# Patient Record
Sex: Female | Born: 1980 | Race: White | Hispanic: No | Marital: Married | Smoking: Never smoker
Health system: Southern US, Community
[De-identification: ages and names within clinical notes are randomized; demographics above are authoritative.]

## PROBLEM LIST (undated history)

## (undated) DIAGNOSIS — K219 Gastro-esophageal reflux disease without esophagitis: Secondary | ICD-10-CM

## (undated) HISTORY — PX: APPENDECTOMY: SHX54

---

## 2007-01-10 ENCOUNTER — Emergency Department (HOSPITAL_COMMUNITY): Admission: EM | Admit: 2007-01-10 | Discharge: 2007-01-10 | Payer: Self-pay | Admitting: Emergency Medicine

## 2013-11-03 ENCOUNTER — Emergency Department
Admission: EM | Admit: 2013-11-03 | Discharge: 2013-11-03 | Disposition: A | Discharge status: Home / Self Care | Payer: BC Managed Care – PPO | Source: Home / Self Care | Attending: Emergency Medicine | Admitting: Emergency Medicine

## 2013-11-03 ENCOUNTER — Encounter: Payer: Self-pay | Admitting: Emergency Medicine

## 2013-11-03 ENCOUNTER — Emergency Department (INDEPENDENT_AMBULATORY_CARE_PROVIDER_SITE_OTHER): Payer: BC Managed Care – PPO

## 2013-11-03 DIAGNOSIS — M7989 Other specified soft tissue disorders: Secondary | ICD-10-CM

## 2013-11-03 DIAGNOSIS — S93402A Sprain of unspecified ligament of left ankle, initial encounter: Secondary | ICD-10-CM

## 2013-11-03 DIAGNOSIS — S93409A Sprain of unspecified ligament of unspecified ankle, initial encounter: Secondary | ICD-10-CM

## 2013-11-03 MED ORDER — HYDROCODONE-ACETAMINOPHEN 5-325 MG PO TABS: 1.0000 | ORAL_TABLET | ORAL | Status: DC | PRN

## 2013-11-03 NOTE — Discharge Instructions (Signed)
Your x-ray of left ankle was negative.--No fracture or dislocation seen.  For mild to moderate pain, may use over-the-counter ibuprofen or Mobic as prescribed.  For severe acute pain, Vicodin, 1 or 2 every 4-6 hours if needed.  Followup with orthopedist in one week

## 2013-11-03 NOTE — ED Provider Notes (Signed)
CSN: [id number]     Arrival date & time 11/03/13  1744 History   First MD Initiated Contact with Patient 11/03/13 1811     Chief Complaint  Patient presents with  . Ankle Pain    left ankle pain x 1 day   (Consider location/radiation/quality/duration/timing/severity/associated sxs/prior Treatment) Patient is a 33 y.o. female presenting with ankle pain.  Ankle Pain  recalls no injury, but she has been very active the past few days. Complains of moderate to severe sharp left ankle pain times one day . No history of gout or arthritis.  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  No fever or chills. No coryza or URI symptoms or chest pain or dyspnea or GI symptoms. No rash  History reviewed. No pertinent past medical history. History reviewed. No pertinent past surgical history. Family History  Problem Relation Age of Onset  . Hypertension Mother    History  Substance Use Topics  . Smoking status: Never Smoker   . Smokeless tobacco: Never Used  . Alcohol Use: Yes   OB History   Grav Para Term Preterm Abortions TAB SAB Ect Mult Living                 Review of Systems  All other systems reviewed and are negative.    Allergies  Review of patient's allergies indicates no known allergies.  Home Medications   Current Outpatient Rx  Name  Route  Sig  Dispense  Refill  . buPROPion (WELLBUTRIN SR) 150 MG 12 hr tablet   Oral   Take 150 mg by mouth 2 (two) times daily.         Marland Kitchen. HYDROcodone-acetaminophen (NORCO/VICODIN) 5-325 MG per tablet   Oral   Take 1-2 tablets by mouth every 4 (four) hours as needed for severe pain. Take with food.   12 tablet   0    BP 120/84  Pulse 105  Temp(Src) 98.8 F (37.1 C) (Oral)  Ht 5\' 5"  (1.651 m)  Wt 221 lb (100.245 kg)  BMI 36.78 kg/m2  SpO2 97%  LMP 10/20/2013 Physical Exam  Nursing note and vitals reviewed. Constitutional: She is oriented to  person, place, and time. She appears well-developed and well-nourished. No distress.  HENT:  Head: Normocephalic and atraumatic.  Eyes: Conjunctivae and EOM are normal. Pupils are equal, round, and reactive to light. No scleral icterus.  Neck: Normal range of motion.  Cardiovascular: Normal rate.   Pulmonary/Chest: Effort normal.  Abdominal: She exhibits no distension.  Musculoskeletal:       Left ankle: She exhibits decreased range of motion and swelling (Lateral malleolus). She exhibits no deformity, no laceration and normal pulse. Tenderness. Lateral malleolus and AITFL tenderness found. No medial malleolus, no posterior TFL, no head of 5th metatarsal and no proximal fibula tenderness found. Achilles tendon normal.       Left foot: Normal.  Neurological: She is alert and oriented to person, place, and time.  Skin: Skin is warm.  Psychiatric: She has a normal mood and affect.   No redness or heat --left ankle ED Course  Procedures (including critical care time) Labs Review Labs Reviewed - No data to display Imaging Review Dg Ankle Complete Left  11/03/2013   CLINICAL DATA:  Left lateral ankle pain.  EXAM: LEFT ANKLE COMPLETE - 3+ VIEW  COMPARISON:  None.  FINDINGS: Mild swelling is noted over the  lateral malleolus. The joint is located. No acute bone or soft tissue abnormality is present. There is no significant joint effusion.  IMPRESSION: 1. Mild soft tissue swelling over the lateral malleolus without an underlying fracture. 2. No acute osseous abnormality.   Electronically Signed   By: Gennette Pac M.D.   On: 11/03/2013 18:57    EKG Interpretation    Date/Time:    Ventricular Rate:    PR Interval:    QRS Duration:   QT Interval:    QTC Calculation:   R Axis:     Text Interpretation:             X-ray reviewed by me in detail. No acute abnormalities seen. No fracture or dislocation.--There is soft tissue swelling over lateral malleolus MDM   1. Severe sprain of  left ankle    Discussed with patient Encourage rest, ice, compression with ACE bandage, and elevation of injured body part. She prefers to use an ankle brace that she has at home from a prior injury a long time ago. Mobic for moderate pain. I prescribed a small amount of Vicodin when necessary severe pain Precautions discussed. Red flags discussed. Questions invited and answered. Patient voiced understanding and agreement.     Lajean Manes, MD 11/03/13 [phone]

## 2013-11-03 NOTE — ED Notes (Signed)
Kennesha complains of left ankle pain for 1 day. Denies injury.

## 2014-12-01 ENCOUNTER — Ambulatory Visit (INDEPENDENT_AMBULATORY_CARE_PROVIDER_SITE_OTHER): Payer: BLUE CROSS/BLUE SHIELD | Admitting: Family Medicine

## 2014-12-01 ENCOUNTER — Ambulatory Visit (INDEPENDENT_AMBULATORY_CARE_PROVIDER_SITE_OTHER): Payer: BLUE CROSS/BLUE SHIELD

## 2014-12-01 VITALS — BP 168/90 | HR 90 | Temp 97.8°F | Resp 18 | Ht 65.5 in | Wt 230.6 lb

## 2014-12-01 DIAGNOSIS — R059 Cough, unspecified: Secondary | ICD-10-CM

## 2014-12-01 DIAGNOSIS — R0781 Pleurodynia: Secondary | ICD-10-CM

## 2014-12-01 DIAGNOSIS — R05 Cough: Secondary | ICD-10-CM | POA: Diagnosis not present

## 2014-12-01 LAB — POCT CBC
Granulocyte percent: 15 %G — AB (ref 37–80)
HCT, POC: 42.1 % (ref 37.7–47.9)
HEMOGLOBIN: 14.1 g/dL (ref 12.2–16.2)
LYMPH, POC: 3.5 — AB (ref 0.6–3.4)
MCH: 28.1 pg (ref 27–31.2)
MCHC: 33.4 g/dL (ref 31.8–35.4)
MCV: 84 fL (ref 80–97)
MID (cbc): 1.6 — AB (ref 0–0.9)
MPV: 7 fL (ref 0–99.8)
PLATELET COUNT, POC: 384 10*3/uL (ref 142–424)
POC Granulocyte: 74.4 — AB (ref 2–6.9)
POC LYMPH %: 17.5 % (ref 10–50)
POC MID %: 8.1 % (ref 0–12)
RBC: 5.01 M/uL (ref 4.04–5.48)
RDW, POC: 14.4 %
WBC: 20.1 10*3/uL — AB (ref 4.6–10.2)

## 2014-12-01 MED ORDER — AZITHROMYCIN 500 MG PO TABS: 500.0000 mg | ORAL_TABLET | Freq: Every day | ORAL | Status: DC

## 2014-12-01 MED ORDER — ALBUTEROL SULFATE (2.5 MG/3ML) 0.083% IN NEBU
2.5000 mg | INHALATION_SOLUTION | Freq: Once | RESPIRATORY_TRACT | Status: AC
  Administered 2014-12-01: 2.5 mg via RESPIRATORY_TRACT

## 2014-12-01 MED ORDER — HYDROCOD POLST-CHLORPHEN POLST 10-8 MG/5ML PO LQCR: 5.0000 mL | Freq: Two times a day (BID) | ORAL | Status: DC | PRN

## 2014-12-01 MED ORDER — ALBUTEROL SULFATE 108 (90 BASE) MCG/ACT IN AEPB: 2.0000 | INHALATION_SPRAY | RESPIRATORY_TRACT | Status: DC | PRN

## 2014-12-01 NOTE — Patient Instructions (Signed)

## 2014-12-01 NOTE — Progress Notes (Addendum)
Subjective:    Patient ID: Kimberly Archer, female    DOB: [birth year], 34 y.o.   MRN: [id number] This chart was scribed for Norberto SorensonEva Shaw, MD by [place] Peace, ED Scribe. [id number] The patient's care was started at 11:29 AM.  Chief Complaint  Patient presents with  . Cough    X 3 WEEKS, PERSISTENT PRODUCTIVE COUGH WITH GREEN PHLEGM, CURRENTLY ON MEDICATION BUT NOT HELPING  . Chest Pain    RIB PAIN, FELT LIKE SOMETHING POPPED YESTERDAY AND NOW IN PAIN,     HPI HPI Comments: Kimberly Hibbsrin N Archer is a 34 y.o. female [facility] of cough and chest pain/tightness onset a few days ago that has been progressively worsening. Pt reports she has been seen at the hospital and another medical clinic for similar issues which are responsible for current symptoms. Pt states she feels like she's "breathing through a straw" and adds she's experiencing excruciating pain in her side. Pt explains she has not taken any medication today because she has had no relief with any other medications that she has tried. No history of asthma or inhaler use.     History reviewed. No pertinent past medical history. Current Outpatient Prescriptions on File Prior to Visit  Medication Sig Dispense Refill  . buPROPion (WELLBUTRIN SR) 150 MG 12 hr tablet Take 150 mg by mouth 2 (two) times daily.     No current facility-administered medications on file prior to visit.   No Known Allergies    Review of Systems  Respiratory: Positive for cough and chest tightness.   Cardiovascular: Positive for chest pain.      BP 168/90 mmHg  Pulse 90  Temp(Src) 97.8 F (36.6 C) (Oral)  Resp 18  Ht 5' 5.5" (1.664 m)  Wt 230 lb 9.6 oz (104.599 kg)  BMI 37.78 kg/m2  SpO2 98%      Objective:   Physical Exam  Constitutional: She is oriented to person, place, and time. She appears well-developed and well-nourished. No distress.  HENT:  Head: Normocephalic and atraumatic.  Right Ear: Tympanic  membrane normal.  Left Ear: Tympanic membrane normal.  Mouth/Throat: Posterior oropharyngeal erythema present. No oropharyngeal exudate.  Eyes: Conjunctivae and EOM are normal.  Neck: Neck supple. No tracheal deviation present.  Cardiovascular: Normal rate.   Pulmonary/Chest: Effort normal. No respiratory distress.  Basilar inspiratory and expiratory rhonchi that clears with deep breathing.   Musculoskeletal: Normal range of motion.  Neurological: She is alert and oriented to person, place, and time.  Skin: Skin is warm and dry.  Psychiatric: She has a normal mood and affect. Her behavior is normal.  Nursing note and vitals reviewed.    [facility] reading (PRIMARY) by  Dr. Clelia CroftShaw. CXR: normal  Dg Ribs Unilateral W/chest Right  12/01/2014   CLINICAL DATA:  Subacute cough and right anterior rib pain. Initial encounter.  EXAM: RIGHT RIBS AND CHEST - 3+ VIEW  COMPARISON:  01/10/2007  FINDINGS: The cardiomediastinal silhouette is unremarkable.  Mild peribronchial thickening again noted.  There is no evidence of focal airspace disease, pulmonary edema, suspicious pulmonary nodule/mass, pleural effusion, or pneumothorax. No acute bony abnormalities are identified.  IMPRESSION: No evidence of acute cardiopulmonary disease.  Mild chronic peribronchial thickening.   Electronically Signed   By: Harmon PierJeffrey  Hu M.D.   On: 12/01/2014 12:52    Results for orders placed or performed in visit on 12/01/14  POCT CBC  Result Value Ref Range  WBC 20.1 (A) 4.6 - 10.2 K/uL   Lymph, poc 3.5 (A) 0.6 - 3.4   POC LYMPH PERCENT 17.5 10 - 50 %L   MID (cbc) 1.6 (A) 0 - 0.9   POC MID % 8.1 0 - 12 %M   POC Granulocyte 74.4 (A) 2 - 6.9   Granulocyte percent 15.0 (A) 37 - 80 %G   RBC 5.01 4.04 - 5.48 M/uL   Hemoglobin 14.1 12.2 - 16.2 g/dL   HCT, POC 16.1 09.6 - 47.9 %   MCV 84.0 80 - 97 fL   MCH, POC 28.1 27 - 31.2 pg   MCHC 33.4 31.8 - 35.4 g/dL   RDW, POC 04.5 %   Platelet Count, POC 384 142 - 424 K/uL   MPV 7.0 0 -  99.8 fL    Assessment & Plan:   Cough - Plan: POCT CBC, DG Ribs Unilateral W/Chest Right, albuterol (PROVENTIL) (2.5 MG/3ML) 0.083% nebulizer solution 2.5 mg  Rib pain on right side  Meds ordered this encounter  Medications  . methylPREDNISolone (MEDROL DOSEPAK) 4 MG tablet    Sig: Take 4 mg by mouth. follow package directions  . DISCONTD: azithromycin (ZITHROMAX) 500 MG tablet    Sig: Take 1 tablet (500 mg total) by mouth daily.    Dispense:  3 tablet    Refill:  0  . chlorpheniramine-HYDROcodone (TUSSIONEX PENNKINETIC ER) 10-8 MG/5ML LQCR    Sig: Take 5 mLs by mouth every 12 (twelve) hours as needed.    Dispense:  120 mL    Refill:  0  . azithromycin (ZITHROMAX) 500 MG tablet    Sig: Take 1 tablet (500 mg total) by mouth daily.    Dispense:  3 tablet    Refill:  0  . albuterol (PROVENTIL) (2.5 MG/3ML) 0.083% nebulizer solution 2.5 mg    Sig:   . DISCONTD: Albuterol Sulfate (PROAIR RESPICLICK) 108 (90 BASE) MCG/ACT AEPB    Sig: Inhale 2 puffs into the lungs every 4 (four) hours as needed.    Dispense:  1 each    Refill:  0    I personally performed the services described in this documentation, which was scribed in my presence. The recorded information has been reviewed and considered, and addended by me as needed.  Norberto Sorenson, MD MPH

## 2014-12-05 ENCOUNTER — Telehealth: Payer: Self-pay

## 2014-12-05 NOTE — Telephone Encounter (Signed)
Pt saw Kimberly MochaEva N Shaw, MD at 12/01/2014 11:04 AM for Cough, and Rib pain on right side. She was told to call back if she did not feel any better, and she does not. Please advise pt.

## 2014-12-05 NOTE — Telephone Encounter (Signed)
Dr. Clelia CroftShaw I was looking for your plan but didn't see it in your note. What are the next steps?

## 2014-12-06 MED ORDER — AMOXICILLIN-POT CLAVULANATE 875-125 MG PO TABS: 1.0000 | ORAL_TABLET | Freq: Two times a day (BID) | ORAL | Status: DC

## 2014-12-06 MED ORDER — ALBUTEROL SULFATE 108 (90 BASE) MCG/ACT IN AEPB: 2.0000 | INHALATION_SPRAY | RESPIRATORY_TRACT | Status: DC | PRN

## 2014-12-06 NOTE — Telephone Encounter (Signed)
Spoke with pt, advised message from Dr. Clelia CroftShaw. Pt understood. [place]

## 2014-12-06 NOTE — Telephone Encounter (Signed)
I sent augmentin into her pharmacy just now since she did not improve on azithromycin.  She is likely completed her medrol dose pack by now so if she is still having any shortness of breath or wheezing, it would be best for her to come back in to see if she needs any additional steroids, make sure her blood counts are getting better, etc.  She can pick up the augmentin and get 2 doses in today but if she is still feeling worse or her symptoms have not turned around by Sunday (48 hrs) definitely would recommend she be seen again due to her marked elev wbc count and abnml CXR.

## 2015-10-23 ENCOUNTER — Encounter (HOSPITAL_BASED_OUTPATIENT_CLINIC_OR_DEPARTMENT_OTHER): Payer: Self-pay | Admitting: Emergency Medicine

## 2015-10-23 ENCOUNTER — Emergency Department (HOSPITAL_BASED_OUTPATIENT_CLINIC_OR_DEPARTMENT_OTHER)
Admission: EM | Admit: 2015-10-23 | Discharge: 2015-10-23 | Disposition: A | Discharge status: Home / Self Care | Payer: Worker's Compensation | Attending: Emergency Medicine | Admitting: Emergency Medicine

## 2015-10-23 DIAGNOSIS — Z792 Long term (current) use of antibiotics: Secondary | ICD-10-CM | POA: Insufficient documentation

## 2015-10-23 DIAGNOSIS — M5416 Radiculopathy, lumbar region: Secondary | ICD-10-CM | POA: Insufficient documentation

## 2015-10-23 DIAGNOSIS — Y990 Civilian activity done for income or pay: Secondary | ICD-10-CM | POA: Insufficient documentation

## 2015-10-23 DIAGNOSIS — Y9389 Activity, other specified: Secondary | ICD-10-CM | POA: Diagnosis not present

## 2015-10-23 DIAGNOSIS — Y9289 Other specified places as the place of occurrence of the external cause: Secondary | ICD-10-CM | POA: Insufficient documentation

## 2015-10-23 DIAGNOSIS — Z79899 Other long term (current) drug therapy: Secondary | ICD-10-CM | POA: Diagnosis not present

## 2015-10-23 DIAGNOSIS — Z7952 Long term (current) use of systemic steroids: Secondary | ICD-10-CM | POA: Diagnosis not present

## 2015-10-23 DIAGNOSIS — X509XXA Other and unspecified overexertion or strenuous movements or postures, initial encounter: Secondary | ICD-10-CM | POA: Diagnosis not present

## 2015-10-23 DIAGNOSIS — S3992XA Unspecified injury of lower back, initial encounter: Secondary | ICD-10-CM | POA: Diagnosis present

## 2015-10-23 MED ORDER — HYDROCODONE-ACETAMINOPHEN 5-325 MG PO TABS
1.0000 | ORAL_TABLET | Freq: Four times a day (QID) | ORAL | Status: DC | PRN
Start: 2015-10-23 — End: 2017-08-31

## 2015-10-23 MED ORDER — HYDROCODONE-ACETAMINOPHEN 5-325 MG PO TABS
1.0000 | ORAL_TABLET | Freq: Once | ORAL | Status: AC
  Administered 2015-10-23: 1 via ORAL
  Filled 2015-10-23: qty 1

## 2015-10-23 NOTE — ED Notes (Signed)
MD at bedside. 

## 2015-10-23 NOTE — ED Provider Notes (Signed)
CSN: [id number]     Arrival date & time 10/23/15  2034 History  By signing my name below, I, Jarvis Morgan, attest that this documentation has been prepared under the direction and in the presence of Kimberly Libra, MD. Electronically Signed: Jarvis Morgan, ED Scribe. 10/23/2015. 11:06 PM.    Chief Complaint  Patient presents with  . Hip Pain   No language interpreter was used.    HPI Comments: Kimberly Archer is a 35 y.o. female with no PMHx who presents to the Emergency Department complaining of worsening, constant, sharp pain that radiates from her left lower back in about the L4 dermatone that began two days ago. 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 Pain is moderate to severe, worse with ambulation in certain positions. There is associated numbness in the toes of her left foot. She reports she was treated at [facility] and given methocarbamol, Ibuprofen and stretches to do with no significant relief. She denies any other injuries. She denies any other associated symptoms    History reviewed. No pertinent past medical history. History reviewed. No pertinent past surgical history. Family History  Problem Relation Age of Onset  . Hypertension Mother    Social History  Substance Use Topics  . Smoking status: Never Smoker   . Smokeless tobacco: Never Used  . Alcohol Use: Yes   OB History    No data available     Review of Systems A complete 10 system review of systems was obtained and all systems are negative except as noted in the HPI and PMH.     Allergies  Review of patient's allergies indicates no known allergies.  Home Medications   Prior to Admission medications   Medication Sig Start Date End Date Taking? Authorizing Provider  Albuterol Sulfate (PROAIR RESPICLICK) 108 (90 BASE) MCG/ACT AEPB Inhale 2 puffs into the lungs every 4 (four) hours as needed. 12/06/14   Sherren Mocha, MD  amoxicillin-clavulanate  (AUGMENTIN) 875-125 MG per tablet Take 1 tablet by mouth 2 (two) times daily. 12/06/14   Sherren Mocha, MD  azithromycin (ZITHROMAX) 500 MG tablet Take 1 tablet (500 mg total) by mouth daily. 12/01/14   Sherren Mocha, MD  buPROPion (WELLBUTRIN SR) 150 MG 12 hr tablet Take 150 mg by mouth 2 (two) times daily.    Historical Provider, MD  chlorpheniramine-HYDROcodone (TUSSIONEX PENNKINETIC ER) 10-8 MG/5ML LQCR Take 5 mLs by mouth every 12 (twelve) hours as needed. 12/01/14   Sherren Mocha, MD  methylPREDNISolone (MEDROL DOSEPAK) 4 MG tablet Take 4 mg by mouth. follow package directions 11/28/14   Historical Provider, MD   Triage Vitals: BP 135/74 mmHg  Pulse 96  Temp(Src) 98.5 F (36.9 C) (Oral)  Resp 18  Ht 5\' 5"  (1.651 m)  Wt 230 lb (104.327 kg)  BMI 38.27 kg/m2  SpO2 100%  LMP 09/22/2015 (Approximate)  Physical Exam  Nursing note and vitals reviewed. General: Well-developed, well-nourished female in no acute distress; appearance consistent with age of record HENT: normocephalic; atraumatic Eyes: pupils equal, round and reactive to light; extraocular muscles intact Neck: supple Heart: regular rate and rhythm Lungs: clear to auscultation bilaterally Abdomen: soft; nondistended; nontender; bowel sounds present Back: Left paralumbar tenderness; positive SLR on left at 30 degrees, negative SLR on right Extremities: No deformity; full range of motion except left hip limited by pain; pulses normal Neurologic: Awake, alert and oriented; motor function intact in all  extremities and symmetric; no facial droop Skin: Warm and dry Psychiatric: Normal mood and affect   ED Course  Procedures (including critical care time)   MDM  I personally performed the services described in this documentation, which was scribed in my presence. The recorded information has been reviewed and is accurate.    Kimberly LibraJohn Emera Bussie, MD 10/23/15 [phone]

## 2015-10-23 NOTE — ED Notes (Signed)
Patient states that she was bending over to help a patient and she felt a sharp pain to her left leg. [facility]. Today she states that she started to have numbess and tingling to her left leg and foot

## 2015-10-29 ENCOUNTER — Other Ambulatory Visit: Payer: Self-pay | Admitting: Occupational Medicine

## 2015-10-29 ENCOUNTER — Ambulatory Visit: Payer: Self-pay

## 2015-10-29 DIAGNOSIS — M549 Dorsalgia, unspecified: Secondary | ICD-10-CM

## 2016-04-28 ENCOUNTER — Encounter: Payer: Self-pay | Admitting: Emergency Medicine

## 2016-04-28 ENCOUNTER — Emergency Department (INDEPENDENT_AMBULATORY_CARE_PROVIDER_SITE_OTHER): Payer: Managed Care, Other (non HMO)

## 2016-04-28 ENCOUNTER — Emergency Department
Admission: EM | Admit: 2016-04-28 | Discharge: 2016-04-28 | Disposition: A | Discharge status: Home / Self Care | Payer: Managed Care, Other (non HMO) | Source: Home / Self Care | Attending: Family Medicine | Admitting: Family Medicine

## 2016-04-28 DIAGNOSIS — M25571 Pain in right ankle and joints of right foot: Secondary | ICD-10-CM

## 2016-04-28 DIAGNOSIS — M7671 Peroneal tendinitis, right leg: Secondary | ICD-10-CM | POA: Diagnosis not present

## 2016-04-28 MED ORDER — MELOXICAM 15 MG PO TABS: 15.0000 mg | ORAL_TABLET | Freq: Every day | ORAL | Status: DC

## 2016-04-28 NOTE — ED Provider Notes (Signed)
CSN: [id number]     Arrival date & time 04/28/16  [id number] History   First MD Initiated Contact with Patient 04/28/16 [phone]     Chief Complaint  Patient presents with  . Ankle Pain      HPI Comments: Patient complains of onset of right lateral ankle pain three days ago while working out in gym.  The pain seemed to resolve yesterday while at work, then recurred yesterday evening.  She has pain when pushing down the gas pedal in her car.  She recalls no injury.  She had similar pain two months ago that resolved.  She has had no response to Ibuprofen.  Patient is a 35 y.o. female presenting with ankle pain. The history is provided by the patient.  Ankle Pain Location:  Ankle Time since incident:  3 days Injury: no   Ankle location:  R ankle Pain details:    Quality:  Aching   Radiates to:  Does not radiate   Severity:  Mild   Onset quality:  Sudden   Duration:  3 days   Timing:  Constant   Progression:  Worsening Chronicity:  Recurrent Dislocation: no   Prior injury to area:  No Relieved by:  Nothing Exacerbated by: plantar flexion. Ineffective treatments:  NSAIDs Associated symptoms: decreased ROM and stiffness   Associated symptoms: no back pain, no fever, no muscle weakness, no numbness, no swelling and no tingling   Risk factors: obesity     History reviewed. No pertinent past medical history. History reviewed. No pertinent past surgical history. Family History  Problem Relation Age of Onset  . Hypertension Mother    Social History  Substance Use Topics  . Smoking status: Never Smoker   . Smokeless tobacco: Never Used  . Alcohol Use: Yes   OB History    No data available     Review of Systems  Constitutional: Negative for fever.  Musculoskeletal: Positive for stiffness. Negative for back pain.  All other systems reviewed and are negative.   Allergies  Review of patient's allergies indicates no known allergies.  Home Medications   Prior to Admission medications    Medication Sig Start Date End Date Taking? Authorizing Provider  omeprazole (PRILOSEC) 10 MG capsule Take 10 mg by mouth daily.   Yes Historical Provider, MD  buPROPion (WELLBUTRIN SR) 150 MG 12 hr tablet Take 150 mg by mouth 2 (two) times daily.    Historical Provider, MD  HYDROcodone-acetaminophen (NORCO/VICODIN) 5-325 MG tablet Take 1-2 tablets by mouth every 6 (six) hours as needed (for pain). 10/23/15   John Molpus, MD  meloxicam (MOBIC) 15 MG tablet Take 1 tablet (15 mg total) by mouth daily. Take with food each morning 04/28/16   Lattie HawStephen A Beese, MD   Meds Ordered and Administered this Visit  Medications - No data to display  BP 120/81 mmHg  Pulse 77  Temp(Src) 98.1 F (36.7 C) (Oral)  Ht 5\' 5"  (1.651 m)  Wt 196 lb (88.905 kg)  BMI 32.62 kg/m2  SpO2 97%  LMP 04/14/2016 No data found.   Physical Exam  Constitutional: She is oriented to person, place, and time. She appears well-developed and well-nourished. No distress.  HENT:  Head: Normocephalic.  Eyes: Conjunctivae are normal. Pupils are equal, round, and reactive to light.  Pulmonary/Chest: Effort normal.  Musculoskeletal:       Right ankle: She exhibits decreased range of motion. She exhibits no swelling, no ecchymosis, no deformity, no laceration and normal pulse. Tenderness. Lateral  malleolus tenderness found. No AITFL, no CF ligament, no posterior TFL, no head of 5th metatarsal and no proximal fibula tenderness found. Achilles tendon normal.       Feet:  Right ankle has decreased range of motion.  Joint is stable. There is tenderness over the course of the right peroneal tendon and base of 5th metatarsal.  Pain is elicited with resisted eversion and resisted plantar flexion of the ankle.  Distal neurovascular function is intact.    Neurological: She is alert and oriented to person, place, and time.  Skin: Skin is warm and dry. No rash noted.  Nursing note and vitals reviewed.   ED Course  Procedures   None  Imaging Review Dg Ankle Complete Right  04/28/2016  CLINICAL DATA:  Chronic pain laterally EXAM: RIGHT ANKLE - COMPLETE 3+ VIEW COMPARISON:  None. FINDINGS: Frontal, oblique, and lateral views obtained. There is mild generalized soft tissue swelling. Calcifications medial and inferior to the medial malleolus likely represent residua of old trauma. There is no demonstrable acute fracture or joint effusion. The ankle mortise appears intact. There is joint space narrowing medially. No erosive change. IMPRESSION: Evidence of old trauma in the medial malleolar region. Mild soft tissue swelling. No acute fracture evident. Ankle mortise appears intact. Mild osteoarthritic change noted. Electronically Signed   By: Bretta Bang III M.D.   On: 04/28/2016 09:46      MDM   1. Peroneal tendonitis of right lower extremity    Dispensed AirCast Stirrup splint.  Begin Mobic  daily. Apply ice pack for 20 to 30 minutes, 3 to 4 times daily  Continue until pain decreases.   Wear AirCast splint. Followup with Dr. Rodney Langton or Dr. Clementeen Graham ([facility]) if not improving about two weeks.     Lattie Haw, MD 04/28/16 1009

## 2016-04-28 NOTE — ED Notes (Signed)
Right ankle pain x 2 days, denies injury, just pain. 3/10

## 2016-04-28 NOTE — Discharge Instructions (Signed)
Apply ice pack for 20 to 30 minutes, 3 to 4 times daily  Continue until pain decreases.   Wear AirCast splint.    Peroneal Tendinitis With Rehab Tendonitis is inflammation of a tendon. Inflammation of the tendons on the back of the outer ankle (peroneal tendons) is known as peroneal tendonitis. The peroneal tendons are responsible for connecting the muscles that allow you to stand on your tiptoes to the bones of the ankle. For this reason, peroneal tendonitis often causes pain when trying to complete such motions. Peroneal tendonitis often involves a tear (strain) of the peroneal tendons. Strains are classified into three categories. Grade 1 strains cause pain, but the tendon is not lengthened. Grade 2 strains include a lengthened ligament, due to the ligament being stretched or partially ruptured. With grade 2 strains there is still function, although function may be decreased. Grade 3 strains involve a complete tear of the tendon or muscle, and function is usually impaired. SYMPTOMS   Pain, tenderness, swelling, warmth, or redness over the back of the outer side of the ankle, the outer part of the mid-foot, or the bottom of the arch.  Pain that gets worse with ankle motion (especially when pushing off or pushing down with the front of the foot), or when standing on the ball of the foot or pushing the foot outward.  Crackling sound (crepitation) when the tendon is moved or touched. CAUSES  Peroneal tendinitis occurs when injury to the peroneal tendons causes the body to respond with inflammation. Common causes of injury include:  An overuse injury, in which the groove behind the outer ankle (where the tendon is located) causes wear on the tendon.  A sudden stress placed on the tendon, such as from an increase in the intensity, frequency, or duration of training.  Direct hit (trauma) to the tendon.  Return to activity too soon after a previous ankle injury. RISK INCREASES WITH:  Sports that  require sudden, repetitive pushing off of the foot, such as jumping or quick starts.  Kicking and running sports, especially running down hills or long distances.  Poor strength and flexibility.  Previous injury to the foot, ankle, or leg. PREVENTION  Warm up and stretch properly before activity.  Allow for adequate recovery between workouts.  Maintain physical fitness:  Strength, flexibility, and endurance.  Cardiovascular fitness.  Complete rehabilitation after previous injury. PROGNOSIS  If treated properly, peroneal tendonitis usually heals within 6 weeks.  RELATED COMPLICATIONS  Longer healing time, if not properly treated or if not given enough time to heal.  Recurring symptoms if activity is resumed too soon, with overuse, or when using poor technique.  If untreated, tendinitis may result in tendon rupture, requiring surgery. TREATMENT  Treatment first involves the use of ice and medicine to reduce pain and inflammation. The use of strengthening and stretching exercises may help reduce pain with activity. These exercises may be performed at unsuccessful, surgery to remove the inflamed tendon lining (sheath) may be advised.  MEDICATION   If pain medicine is needed, nonsteroidal anti-inflammatory medicines (aspirin and ibuprofen), or other minor pain relievers (acetaminophen), are often advised.  Do not take pain medicine for 7 days before surgery.  Prescription pain relievers may be given, if your caregiver thinks they are needed. Use only as directed and only as much as you need. HEAT AND COLD  Cold treatment (icing) should be applied for 10 to 15 minutes every 2 to 3 hours for inflammation and pain, and immediately after activity  that aggravates your symptoms. Use ice packs or an ice massage.  Heat treatment may be used before performing stretching and strengthening activities prescribed by your caregiver, physical therapist, or athletic trainer. Use a heat pack or a  warm water soak. SEEK MEDICAL CARE IF:  Symptoms get worse or do not improve in 2 to 4 weeks, despite treatment.  New, unexplained symptoms develop. (Drugs used in treatment may produce side effects.) EXERCISES RANGE OF MOTION (ROM) AND STRETCHING EXERCISES - Peroneal Tendinitis These exercises may help you when beginning to rehabilitate your injury. Your symptoms may resolve with or without further involvement from your physician, physical therapist or athletic trainer. While completing these exercises, remember:   Restoring tissue flexibility helps normal motion to return to the joints. This allows healthier, less painful movement and activity.  An effective stretch should be held for at least 30 seconds.  A stretch should never be painful. You should only feel a gentle lengthening or release in the stretched tissue. RANGE OF MOTION - Ankle Eversion  Sit with your right / left ankle crossed over your opposite knee.  Grip your foot with your opposite hand, placing your thumb on the top of your foot and your fingers across the bottom of your foot.  Gently push your foot downward with a slight rotation, so your littlest toes rise slightly toward the ceiling.  You should feel a gentle stretch on the inside of your ankle. Hold the stretch for __________ seconds. Repeat __________ times. Complete this exercise __________ times per day.  RANGE OF MOTION - Ankle Inversion  Sit with your right / left ankle crossed over your opposite knee.  Grip your foot with your opposite hand, placing your thumb on the bottom of your foot and your fingers across the top of your foot.  Gently pull your foot so the smallest toe comes toward you and your thumb pushes the inside of the ball of your foot away from you.  You should feel a gentle stretch on the outside of your ankle. Hold the stretch for __________ seconds. Repeat __________ times. Complete this exercise __________ times per day.  RANGE OF  MOTION - Ankle Plantar Flexion  Sit with your right / left leg crossed over your opposite knee.  Use your opposite hand to pull the top of your foot and toes toward you.  You should feel a gentle stretch on the top of your foot and ankle. Hold this position for __________ seconds. Repeat __________ times. Complete __________ times per day.  STRETCH - Gastroc, Standing  Place your hands on a wall.  Extend your right / left leg behind you, keeping the front knee somewhat bent.  Slightly point your toes inward on your back foot.  Keeping your right / left heel on the floor and your knee straight, shift your weight toward the wall, not allowing your back to arch.  You should feel a gentle stretch in the calf. Hold this position for __________ seconds. Repeat __________ times. Complete this stretch __________ times per day. STRETCH - Soleus, Standing  Place your hands on a wall.  Extend your right / left leg behind you, keeping the other knee somewhat bent.  Slightly point your toes inward on your back foot.  Keep your heel on the floor, bend your back knee, and slightly shift your weight over the back leg so that you feel a gentle stretch deep in your back calf.  Hold this position for __________ seconds. Repeat __________ times.  Complete this stretch __________ times per day. STRETCH - Gastrocsoleus, Standing Note: This exercise can place a lot of stress on your foot and ankle. Please complete this exercise only if specifically instructed by your caregiver.   Place the ball of your right / left foot on a step, keeping your other foot firmly on the same step.  Hold on to the wall or a rail for balance.  Slowly lift your other foot, allowing your body weight to press your heel down over the edge of the step.  You should feel a stretch in your right / left calf.  Hold this position for __________ seconds.  Repeat this exercise with a slight bend in your knee. Repeat __________  times. Complete this stretch __________ times per day.  STRENGTHENING EXERCISES - Peroneal Tendinitis  These exercises may help you when beginning to rehabilitate your injury. They may resolve your symptoms with or without further involvement from your physician, physical therapist or athletic trainer. While completing these exercises, remember:   Muscles can gain both the endurance and the strength needed for everyday activities through controlled exercises.  Complete these exercises as instructed by your physician, physical therapist or athletic trainer. Increase the resistance and repetitions only as guided by your caregiver. STRENGTH - Dorsiflexors  Secure a rubber exercise band or tubing to a fixed object (table, pole) and loop the other end around your right / left foot.  Sit on the floor facing the fixed object. The band should be slightly tense when your foot is relaxed.  Slowly draw your foot back toward you, using your ankle and toes.  Hold this position for __________ seconds. Slowly release the tension in the band and return your foot to the starting position. Repeat __________ times. Complete this exercise __________ times per day.  STRENGTH - Towel Curls  Sit in a chair, on a non-carpeted surface.  Place your foot on a towel, keeping your heel on the floor.  Pull the towel toward your heel only by curling your toes. Keep your heel on the floor.  If instructed by your physician, physical therapist or athletic trainer, add weight to the end of the towel. Repeat __________ times. Complete this exercise __________ times per day. STRENGTH - Ankle Eversion   Secure one end of a rubber exercise band or tubing to a fixed object (table, pole). Loop the other end around your foot, just before your toes.  Place your fists between your knees. This will focus your strengthening at your ankle.  Drawing the band across your opposite foot, away from the pole, slowly, pull your little  toe out and up. Make sure the band is positioned to resist the entire motion.  Hold this position for __________ seconds.  Have your muscles resist the band, as it slowly pulls your foot back to the starting position. Repeat __________ times. Complete this exercise __________ times per day.    This information is not intended to replace advice given to you by your health care provider. Make sure you discuss any questions you have with your health care provider.   Document Released: 10/04/2005 Document Revised: 02/18/2015 Document Reviewed: 01/16/2009 Elsevier Interactive Patient Education [employer]

## 2017-08-31 ENCOUNTER — Other Ambulatory Visit: Payer: Self-pay

## 2017-08-31 ENCOUNTER — Encounter: Payer: Self-pay | Admitting: *Deleted

## 2017-08-31 ENCOUNTER — Emergency Department
Admission: EM | Admit: 2017-08-31 | Discharge: 2017-08-31 | Disposition: A | Discharge status: Home / Self Care | Payer: Federal, State, Local not specified - PPO | Source: Home / Self Care | Attending: Family Medicine | Admitting: Family Medicine

## 2017-08-31 DIAGNOSIS — B9789 Other viral agents as the cause of diseases classified elsewhere: Secondary | ICD-10-CM

## 2017-08-31 DIAGNOSIS — J069 Acute upper respiratory infection, unspecified: Secondary | ICD-10-CM

## 2017-08-31 HISTORY — DX: Gastro-esophageal reflux disease without esophagitis: K21.9

## 2017-08-31 LAB — POCT RAPID STREP A (OFFICE): RAPID STREP A SCREEN: NEGATIVE

## 2017-08-31 MED ORDER — AZITHROMYCIN 250 MG PO TABS: ORAL_TABLET | ORAL | 0 refills | Status: DC

## 2017-08-31 NOTE — Discharge Instructions (Signed)
Take plain guaifenesin (200mg  tabs, one or two every 4 hours) with plenty of water, for cough and congestion.  May add Pseudoephedrine (30mg , one or two every 4 to 6 hours) for sinus congestion.  Get adequate rest.   Also recommend using saline nasal spray several times daily and saline nasal irrigation (AYR is a common brand).  Use Flonase nasal spray each morning after using saline nasal irrigation. Try warm salt water gargles for sore throat.  Stop all antihistamines for now, and other non-prescription cough/cold preparations. May take Ibuprofen 200mg , 4 tabs every 8 hours with food for sore throat, fever, etc. May take Delsym Cough Suppressant at bedtime for nighttime cough.  Begin Azithromycin if not improving about one week or if persistent fever develops   Follow-up with family doctor if not improving about10 days.

## 2017-08-31 NOTE — ED Provider Notes (Signed)
[facility]    CSN: [id number] Arrival date & time: 08/31/17  0805     History   Chief Complaint Chief Complaint  Patient presents with  . Sore Throat    HPI Kimberly Archer is a 36 y.o. female.   Patient complains of three day history of typical cold-like symptoms developing over several days, including mild sore throat, hoarseness, sinus congestion, headache, fatigue, and cough.  Today she developed sweats and fever, and feels pressure in her left ear.    The history is provided by the patient.    Past Medical History:  Diagnosis Date  . GERD (gastroesophageal reflux disease)     There are no active problems to display for this patient.   History reviewed. No pertinent surgical history.  OB History    No data available       Home Medications    Prior to Admission medications   Medication Sig Start Date End Date Taking? Authorizing Provider  montelukast (SINGULAIR) 10 MG tablet Take 10 mg at bedtime by mouth.   Yes [provider]  omeprazole (PRILOSEC) 10 MG capsule Take 10 mg by mouth daily.   Yes [provider]  azithromycin (ZITHROMAX Z-PAK) 250 MG tablet Take 2 tabs today; then begin one tab once daily for 4 more days. (Rx void after 09/08/17) 08/31/17   Lattie HawBeese, Knox Holdman A, MD    Family History Family History  Problem Relation Age of Onset  . Hypertension Mother     Social History Social History   Tobacco Use  . Smoking status: Never Smoker  . Smokeless tobacco: Never Used  Substance Use Topics  . Alcohol use: Yes  . Drug use: No     Allergies   Patient has no known allergies.   Review of Systems Review of Systems  + sore throat + cough No pleuritic pain No wheezing + nasal congestion + post-nasal drainage + sinus pain/pressure No itchy/red eyes + left earache No hemoptysis No SOB + fever, + chills No nausea No vomiting No abdominal pain No diarrhea No urinary symptoms No skin rash +  fatigue No myalgias + headache Used OTC meds without relief    Physical Exam Triage Vital Signs ED Triage Vitals  Enc Vitals Group     BP 08/31/17 0826 (!) 145/93     Pulse Rate 08/31/17 0826 89     Resp 08/31/17 0826 16     Temp 08/31/17 0826 100.1 F (37.8 C)     Temp Source 08/31/17 0826 Oral     SpO2 08/31/17 0826 97 %     Weight 08/31/17 0826 192 lb (87.1 kg)     Height 08/31/17 0826 5\' 5"  (1.651 m)     Head Circumference --      Peak Flow --      Pain Score 08/31/17 0827 0     Pain Loc --      Pain Edu? --      Excl. in GC? --    No data found.  Updated Vital Signs BP (!) 145/93 (BP Location: Left Arm)   Pulse 89   Temp 100.1 F (37.8 C) (Oral)   Resp 16   Ht 5\' 5"  (1.651 m)   Wt 192 lb (87.1 kg)   LMP 08/31/2017   SpO2 97%   BMI 31.95 kg/m   Visual Acuity Right Eye Distance:   Left Eye Distance:   Bilateral Distance:    Right Eye Near:   Left  Eye Near:    Bilateral Near:     Physical Exam Nursing notes and Vital Signs reviewed. Appearance:  Patient appears stated age, and in no acute distress Eyes:  Pupils are equal, round, and reactive to light and accomodation.  Extraocular movement is intact.  Conjunctivae are not inflamed  Ears:  Canals normal.  Tympanic membranes normal.  Nose:  Mildly congested turbinates.  No sinus tenderness.  Neck:  Supple.  Enlarged posterior/lateral nodes are palpated bilaterally, tender to palpation on the left.   Lungs:  Clear to auscultation.  Breath sounds are equal.  Moving air well. Heart:  Regular rate and rhythm without murmurs, rubs, or gallops.  Abdomen:  Nontender without masses or hepatosplenomegaly.  Bowel sounds are present.  No CVA or flank tenderness.  Extremities:  No edema.  Skin:  No rash present.    UC Treatments / Results  Labs (all labs ordered are listed, but only abnormal results are displayed) Labs Reviewed  POCT RAPID STREP A (OFFICE) negative    EKG  EKG Interpretation None        Radiology No results found.  Procedures Procedures (including critical care time)  Medications Ordered in UC Medications - No data to display   Initial Impression / Assessment and Plan / UC Course  I have reviewed the triage vital signs and the nursing notes.  Pertinent labs & imaging results that were available during my care of the patient were reviewed by me and considered in my medical decision making (see chart for details).    There is no evidence of bacterial infection today.   Treat symptomatically for now  Take plain guaifenesin (200mg  tabs, one or two every 4 hours) with plenty of water, for cough and congestion.  May add Pseudoephedrine (30mg , one or two every 4 to 6 hours) for sinus congestion.  Get adequate rest.   Also recommend using saline nasal spray several times daily and saline nasal irrigation (AYR is a common brand).  Use Flonase nasal spray each morning after using saline nasal irrigation. Try warm salt water gargles for sore throat.  Stop all antihistamines for now, and other non-prescription cough/cold preparations. May take Ibuprofen 200mg , 4 tabs every 8 hours with food for sore throat, fever, etc. May take Delsym Cough Suppressant at bedtime for nighttime cough.  Begin Azithromycin if not improving about one week or if persistent fever develops (Given a prescription to hold, with an expiration date)  Follow-up with family doctor if not improving about10 days.     Final Clinical Impressions(s) / UC Diagnoses   Final diagnoses:  Viral URI with cough    ED Discharge Orders        Ordered    azithromycin (ZITHROMAX Z-PAK) 250 MG tablet     08/31/17 0851          Lattie HawBeese, Tima Curet A, MD 08/31/17 [phone]

## 2017-08-31 NOTE — ED Triage Notes (Signed)
Pt c/o LT ear pain, laryngitis, sore throat, fever, and cough x 3 days. She took Mucinex with APAP at 0720.

## 2019-11-08 ENCOUNTER — Emergency Department (INDEPENDENT_AMBULATORY_CARE_PROVIDER_SITE_OTHER): Payer: Federal, State, Local not specified - PPO

## 2019-11-08 ENCOUNTER — Other Ambulatory Visit: Payer: Self-pay

## 2019-11-08 ENCOUNTER — Emergency Department
Admission: EM | Admit: 2019-11-08 | Discharge: 2019-11-08 | Disposition: A | Discharge status: Home / Self Care | Payer: Federal, State, Local not specified - PPO | Source: Home / Self Care | Attending: Family Medicine | Admitting: Family Medicine

## 2019-11-08 DIAGNOSIS — R0609 Other forms of dyspnea: Secondary | ICD-10-CM

## 2019-11-08 DIAGNOSIS — R0600 Dyspnea, unspecified: Secondary | ICD-10-CM

## 2019-11-08 NOTE — Discharge Instructions (Addendum)
Try taking Prilosec for 7 to 10 days. If symptoms persist, recommend evaluation by pulmonologist. If intermittent palpitations persist, recommend evaluation by cardiologist.

## 2019-11-08 NOTE — ED Triage Notes (Signed)
Pt presents to clinic for unresolved sob. She was seen approximately 2 weeks ago to r/o covid. EKG and d-dimer done all results unremarkable. Of note, in 2008 while in a flight pt had to be taken to ER for respiratory distress as it was found she had enlarged lymph nodes affecting her lungs. Pt recently vaccinated for COVID. No other concerns/questions at this time.

## 2019-11-10 NOTE — ED Provider Notes (Signed)
[name]    CSN: [id number] Arrival date & time: 11/08/19  1347      History   Chief Complaint Chief Complaint  Patient presents with  . Shortness of Breath    HPI Kimberly Archer is a 39 y.o. female.   Patient complains of persistent shortness of breath and chest pressure with activity.  She was evaluated two weeks ago by her PCP.  At that time her exam, EKG, and D-dimer were normal.  COVID19 test was negative.  She states that she has recently been vaccinated for [place]. She complains of occasional brief fluttering in her chest.  She has a history of GERD for which she has taken Prilosec in the past. Of note:  Patient was evaluated for shortness of breath 01/11/2007.  Chest x-ray at that time revealed hilar adenopathy, ?sarcoid.  The history is provided by the patient.  Shortness of Breath Severity:  Mild Onset quality:  Gradual Duration:  3 weeks Timing:  Intermittent Progression:  Unchanged Chronicity:  Recurrent Context: activity   Context: not URI   Relieved by:  Nothing Worsened by:  Exertion and activity Ineffective treatments:  None tried Associated symptoms: no abdominal pain, no chest pain, no cough, no diaphoresis, no fever, no hemoptysis, no neck pain, no PND, no sore throat, no sputum production, no syncope, no swollen glands and no wheezing   Risk factors: obesity     Past Medical History:  Diagnosis Date  . GERD (gastroesophageal reflux disease)     There are no problems to display for this patient.   History reviewed. No pertinent surgical history.  OB History   No obstetric history on file.      Home Medications    Prior to Admission medications   Medication Sig Start Date End Date Taking? Authorizing Provider  azithromycin (ZITHROMAX Z-PAK) 250 MG tablet Take 2 tabs today; then begin one tab once daily for 4 more days. (Rx void after 09/08/17) 08/31/17   Kandra Nicolas, MD  montelukast (SINGULAIR) 10 MG tablet Take 10 mg  at bedtime by mouth.    [provider]  omeprazole (PRILOSEC) 10 MG capsule Take 10 mg by mouth daily.    [provider]  Albuterol Sulfate (PROAIR RESPICLICK) 258 (90 BASE) MCG/ACT AEPB Inhale 2 puffs into the lungs every 4 (four) hours as needed. 12/06/14 10/23/15  Shawnee Knapp, MD    Family History Family History  Problem Relation Age of Onset  . Hypertension Mother     Social History Social History   Tobacco Use  . Smoking status: Never Smoker  . Smokeless tobacco: Never Used  Substance Use Topics  . Alcohol use: Yes  . Drug use: No     Allergies   Patient has no known allergies.   Review of Systems Review of Systems  Constitutional: Negative for diaphoresis and fever.  HENT: Negative for rhinorrhea and sore throat.   Respiratory: Positive for shortness of breath. Negative for cough, hemoptysis, sputum production and wheezing.   Cardiovascular: Negative for chest pain, syncope and PND.  Gastrointestinal: Negative for abdominal pain.  Musculoskeletal: Negative for neck pain.  All other systems reviewed and are negative.    Physical Exam Triage Vital Signs ED Triage Vitals  Enc Vitals Group     BP 11/08/19 1431 123/83     Pulse Rate 11/08/19 1431 83     Resp 11/08/19 1431 (!) 21     Temp 11/08/19 1431 98.8 F (37.1 C)  Temp Source 11/08/19 1431 Oral     SpO2 11/08/19 1431 99 %     Weight 11/08/19 1432 201 lb (91.2 kg)     Height 11/08/19 1432 5\' 5"  (1.651 m)     Head Circumference --      Peak Flow --      Pain Score 11/08/19 1432 0     Pain Loc --      Pain Edu? --      Excl. in GC? --    No data found.  Updated Vital Signs BP 123/83 (BP Location: Right Arm)   Pulse 83   Temp 98.8 F (37.1 C) (Oral)   Resp (!) 21   Ht 5\' 5"  (1.651 m)   Wt 91.2 kg   SpO2 99%   BMI 33.45 kg/m   Visual Acuity Right Eye Distance:   Left Eye Distance:   Bilateral Distance:    Right Eye Near:   Left Eye Near:    Bilateral Near:      Physical Exam Nursing notes and Vital Signs reviewed. Appearance:  Patient appears stated age, and in no acute distress Eyes:  Pupils are equal, round, and reactive to light and accomodation.  Extraocular movement is intact.  Conjunctivae are not inflamed  Ears:  Canals normal.  Tympanic membranes normal.  Nose:  Normal turbinates.  No sinus tenderness.  Pharynx:  Normal Neck:  Supple.  No adenopathy. Lungs:  Clear to auscultation.  Breath sounds are equal.  Moving air well. Heart:  Regular rate and rhythm without murmurs, rubs, or gallops.  Abdomen:  Nontender without masses or hepatosplenomegaly.  Bowel sounds are present.  No CVA or flank tenderness.  Extremities:  No edema.  Skin:  No rash present.   UC Treatments / Results  Labs (all labs ordered are listed, but only abnormal results are displayed) Labs Reviewed - No data to display  EKG   Radiology DG Chest 2 View  Result Date: 11/08/2019 CLINICAL DATA:  Dyspnea on exertion EXAM: CHEST - 2 VIEW COMPARISON:  12/01/2014 FINDINGS: The heart size and mediastinal contours are within normal limits. Both lungs are clear. The visualized skeletal structures are unremarkable. IMPRESSION: No active cardiopulmonary disease. Electronically Signed   By: 11/10/2019 M.D.   On: 11/08/2019 15:21    Procedures Procedures (including critical care time)  Medications Ordered in UC Medications - No data to display  Initial Impression / Assessment and Plan / UC Course  I have reviewed the triage vital signs and the nursing notes.  Pertinent labs & imaging results that were available during my care of the patient were reviewed by me and considered in my medical decision making (see chart for details).    Normal exam and chest x-ray reassuring.  Patient's recurring chest pressure sensation may represent GERD. Recommend follow-up with PCP.   Final Clinical Impressions(s) / UC Diagnoses   Final diagnoses:  Dyspnea on exertion      Discharge Instructions     Try taking Prilosec for 7 to 10 days. If symptoms persist, recommend evaluation by pulmonologist. If intermittent palpitations persist, recommend evaluation by cardiologist.   ED Prescriptions    None        Jasmine Pang, MD 11/10/19 1222

## 2020-08-27 ENCOUNTER — Other Ambulatory Visit: Payer: Self-pay

## 2020-08-27 ENCOUNTER — Emergency Department
Admission: RE | Admit: 2020-08-27 | Discharge: 2020-08-27 | Disposition: A | Discharge status: Home / Self Care | Payer: Federal, State, Local not specified - PPO | Source: Ambulatory Visit | Attending: Family Medicine | Admitting: Family Medicine

## 2020-08-27 VITALS — BP 117/81 | HR 88 | Temp 98.2°F | Resp 18 | Ht 65.0 in | Wt 200.0 lb

## 2020-08-27 DIAGNOSIS — J069 Acute upper respiratory infection, unspecified: Secondary | ICD-10-CM | POA: Diagnosis not present

## 2020-08-27 DIAGNOSIS — H6982 Other specified disorders of Eustachian tube, left ear: Secondary | ICD-10-CM

## 2020-08-27 MED ORDER — PREDNISONE 20 MG PO TABS: ORAL_TABLET | ORAL | 0 refills | Status: DC

## 2020-08-27 NOTE — Discharge Instructions (Addendum)
Take plain guaifenesin (1200mg extended release tabs such as Mucinex) twice daily, with plenty of water, for cough and congestion.  May add Pseudoephedrine (30mg, one or two every 4 to 6 hours) for sinus congestion.  Get adequate rest.   °May use Afrin nasal spray (or generic oxymetazoline) each morning for about 5 days and then discontinue.  Also recommend using saline nasal spray several times daily and saline nasal irrigation (AYR is a common brand).  Use Flonase nasal spray each morning after using Afrin nasal spray and saline nasal irrigation. °Try warm salt water gargles for sore throat.  °Stop all antihistamines for now, and other non-prescription cough/cold preparations. ° May take Delsym Cough Suppressant ("12 Hour Cough Relief") at bedtime for nighttime cough.  °

## 2020-08-27 NOTE — ED Provider Notes (Signed)
[facility]    CSN: [id number] Arrival date & time: 08/27/20  0807      History   Chief Complaint Chief Complaint  Patient presents with  . Otalgia  . Nasal Congestion    HPI MICHAELAH CREDEUR is a 39 y.o. female.   Six days ago patient developed sore throat, sinus drainage/congestion, fatigue, and myalgias.  She has had an occasional cough.  Last night she developed right earache.  The history is provided by the patient.    Past Medical History:  Diagnosis Date  . GERD (gastroesophageal reflux disease)     There are no problems to display for this patient.   Past Surgical History:  Procedure Laterality Date  . APPENDECTOMY      OB History   No obstetric history on file.      Home Medications    Prior to Admission medications   Medication Sig Start Date End Date Taking? Authorizing Provider  cetirizine (ZYRTEC) 10 MG tablet Take 10 mg by mouth daily.   Yes [provider]  famotidine (PEPCID) 20 MG tablet Take 20 mg by mouth 2 (two) times daily.   Yes [provider]  montelukast (SINGULAIR) 10 MG tablet Take 10 mg at bedtime by mouth.    [provider]  predniSONE (DELTASONE) 20 MG tablet Take one tab by mouth twice daily for 4 days, then one daily for 3 days. Take with food. 08/27/20   Lattie Haw, MD  Albuterol Sulfate (PROAIR RESPICLICK) 108 (90 BASE) MCG/ACT AEPB Inhale 2 puffs into the lungs every 4 (four) hours as needed. 12/06/14 10/23/15  Sherren Mocha, MD    Family History Family History  Problem Relation Age of Onset  . Hypertension Mother     Social History Social History   Tobacco Use  . Smoking status: Never Smoker  . Smokeless tobacco: Never Used  Vaping Use  . Vaping Use: Never used  Substance Use Topics  . Alcohol use: Yes  . Drug use: No     Allergies   Patient has no known allergies.   Review of Systems Review of Systems + sore throat + rare cough No pleuritic pain No  wheezing + nasal congestion + post-nasal drainage No sinus pain/pressure No itchy/red eyes + left earache No hemoptysis No SOB No fever/chills No nausea No vomiting No abdominal pain No diarrhea No urinary symptoms No skin rash + fatigue + myalgias No headache Used OTC meds (Zyrtec) without relief   Physical Exam Triage Vital Signs ED Triage Vitals  Enc Vitals Group     BP 08/27/20 0824 117/81     Pulse Rate 08/27/20 0824 88     Resp 08/27/20 0824 18     Temp 08/27/20 0824 98.2 F (36.8 C)     Temp Source 08/27/20 0824 Oral     SpO2 08/27/20 0824 97 %     Weight 08/27/20 0820 200 lb (90.7 kg)     Height 08/27/20 0820 5\' 5"  (1.651 m)     Head Circumference --      Peak Flow --      Pain Score 08/27/20 0820 0     Pain Loc --      Pain Edu? --      Excl. in GC? --    No data found.  Updated Vital Signs BP 117/81 (BP Location: Right Arm)   Pulse 88   Temp 98.2 F (36.8 C) (Oral)   Resp 18  Ht 5\' 5"  (1.651 m)   Wt 90.7 kg   SpO2 97%   BMI 33.28 kg/m   Visual Acuity Right Eye Distance:   Left Eye Distance:   Bilateral Distance:    Right Eye Near:   Left Eye Near:    Bilateral Near:     Physical Exam Nursing notes and Vital Signs reviewed. Appearance:  Patient appears stated age, and in no acute distress Eyes:  Pupils are equal, round, and reactive to light and accomodation.  Extraocular movement is intact.  Conjunctivae are not inflamed  Ears:  Canals normal.  Tympanic membranes normal.  Nose:  Mildly congested turbinates.  No sinus tenderness.  Pharynx:  Normal Neck:  Supple.  Mildly enlarged lateral nodes are present, tender to palpation on the left.   Lungs:  Clear to auscultation.  Breath sounds are equal.  Moving air well. Heart:  Regular rate and rhythm without murmurs, rubs, or gallops.  Abdomen:  Nontender without masses or hepatosplenomegaly.  Bowel sounds are present.  No CVA or flank tenderness.  Extremities:  No edema.  Skin:  No rash  present.   UC Treatments / Results  Labs (all labs ordered are listed, but only abnormal results are displayed) Labs Reviewed -   Tympanometry:  Right ear tympanogram normal; Left ear tympanogram negative peak pressure  EKG   Radiology No results found.  Procedures Procedures (including critical care time)  Medications Ordered in UC Medications - No data to display  Initial Impression / Assessment and Plan / UC Course  I have reviewed the triage vital signs and the nursing notes.  Pertinent labs & imaging results that were available during my care of the patient were reviewed by me and considered in my medical decision making (see chart for details).    There is no evidence of bacterial infection today.  Begin prednisone burst/taper. Followup with Family Doctor if not improved in about 10 days.   Final Clinical Impressions(s) / UC Diagnoses   Final diagnoses:  Viral URI with cough  Eustachian tube dysfunction, left     Discharge Instructions     Take plain guaifenesin (1200mg  extended release tabs such as Mucinex) twice daily, with plenty of water, for cough and congestion.  May add Pseudoephedrine (30mg , one or two every 4 to 6 hours) for sinus congestion.  Get adequate rest.   May use Afrin nasal spray (or generic oxymetazoline) each morning for about 5 days and then discontinue.  Also recommend using saline nasal spray several times daily and saline nasal irrigation (AYR is a common brand).  Use Flonase nasal spray each morning after using Afrin nasal spray and saline nasal irrigation. Try warm salt water gargles for sore throat.  Stop all antihistamines for now, and other non-prescription cough/cold preparations. May take Delsym Cough Suppressant ("12 Hour Cough Relief") at bedtime for nighttime cough.     ED Prescriptions    Medication Sig Dispense Auth. Provider   predniSONE (DELTASONE) 20 MG tablet Take one tab by mouth twice daily for 4 days, then one daily  for 3 days. Take with food. 11 tablet , MD        , MD 08/31/20 [phone]

## 2020-08-27 NOTE — ED Triage Notes (Addendum)
Pt c/o runny nose and cough x 5 days. Reports LT ear pain x last night. Taking Zinc, Vit C, Zyrtec,and  Zarbee's . Reports Covid vaccine.

## 2020-11-20 ENCOUNTER — Emergency Department
Admission: RE | Admit: 2020-11-20 | Discharge: 2020-11-20 | Disposition: A | Discharge status: Home / Self Care | Payer: Federal, State, Local not specified - PPO | Source: Ambulatory Visit | Attending: Family Medicine | Admitting: Family Medicine

## 2020-11-20 ENCOUNTER — Other Ambulatory Visit: Payer: Self-pay

## 2020-11-20 VITALS — BP 142/95 | HR 95 | Temp 98.6°F | Resp 15

## 2020-11-20 DIAGNOSIS — J069 Acute upper respiratory infection, unspecified: Secondary | ICD-10-CM

## 2020-11-20 MED ORDER — PREDNISONE 20 MG PO TABS: ORAL_TABLET | ORAL | 0 refills | Status: DC

## 2020-11-20 MED ORDER — AMOXICILLIN 875 MG PO TABS: 875.0000 mg | ORAL_TABLET | Freq: Two times a day (BID) | ORAL | 0 refills | Status: DC

## 2020-11-20 NOTE — Discharge Instructions (Addendum)
Take plain guaifenesin (1200mg extended release tabs such as Mucinex) twice daily, with plenty of water, for cough and congestion.  May add Pseudoephedrine (30mg, one or two every 4 to 6 hours) for sinus congestion.  Get adequate rest.   °May use Afrin nasal spray (or generic oxymetazoline) each morning for about 5 days and then discontinue.  Also recommend using saline nasal spray several times daily and saline nasal irrigation (AYR is a common brand).  Use Flonase nasal spray each morning after using Afrin nasal spray and saline nasal irrigation. °Try warm salt water gargles for sore throat.  °Stop all antihistamines for now, and other non-prescription cough/cold preparations. ° May take Delsym Cough Suppressant ("12 Hour Cough Relief") at bedtime for nighttime cough.  °

## 2020-11-20 NOTE — ED Triage Notes (Signed)
Patient presents to Urgent Care with complaints of fatigue, headache, sinus pressure, and left ear pain since about week ago. Patient reports she had covid 2 months ago, states her symptoms are worse at night. Has a productive cough as well. Has been taking allergy pills, flonase, and tylenol.

## 2020-11-20 NOTE — ED Provider Notes (Signed)
[facility]    CSN: [id number] Arrival date & time: 11/20/20  1249      History   Chief Complaint Chief Complaint  Patient presents with  . Fatigue  . Headache    HPI Kimberly Archer is a 40 y.o. female.   Five days ago patient developed a sore throat, fatigue, productive cough, and sinus congestion.  She denies fever but has felt hot.  She denies pleuritic pain and shortness of breath.  She complains of facial pressure and ears feel clogged.  She reports that she had COVID19 two months ago.  The history is provided by the patient.    Past Medical History:  Diagnosis Date  . GERD (gastroesophageal reflux disease)     There are no problems to display for this patient.   Past Surgical History:  Procedure Laterality Date  . APPENDECTOMY      OB History   No obstetric history on file.      Home Medications    Prior to Admission medications   Medication Sig Start Date End Date Taking? Authorizing Provider  amoxicillin (AMOXIL) 875 MG tablet Take 1 tablet (875 mg total) by mouth 2 (two) times daily. 11/20/20  Yes Lattie Haw, MD  cetirizine (ZYRTEC) 10 MG tablet Take 10 mg by mouth daily.    [provider]  famotidine (PEPCID) 20 MG tablet Take 20 mg by mouth 2 (two) times daily.    [provider]  montelukast (SINGULAIR) 10 MG tablet Take 10 mg at bedtime by mouth.    [provider]  predniSONE (DELTASONE) 20 MG tablet Take one tab by mouth twice daily for 4 days, then one daily for 3 days. Take with food. 11/20/20   Lattie Haw, MD  Albuterol Sulfate (PROAIR RESPICLICK) 108 (90 BASE) MCG/ACT AEPB Inhale 2 puffs into the lungs every 4 (four) hours as needed. 12/06/14 10/23/15  Sherren Mocha, MD    Family History Family History  Problem Relation Age of Onset  . Hypertension Mother   . Hypertension Father     Social History Social History   Tobacco Use  . Smoking status: Never Smoker  . Smokeless tobacco: Never Used   Vaping Use  . Vaping Use: Never used  Substance Use Topics  . Alcohol use: Yes  . Drug use: No     Allergies   Patient has no known allergies.   Review of Systems Review of Systems + sore throat + cough No pleuritic pain No wheezing + nasal congestion + post-nasal drainage + sinus pain/pressure No itchy/red eyes ? Earache, ears feel clogged No hemoptysis No SOB No fever, but feels hot No nausea No vomiting No abdominal pain No diarrhea No urinary symptoms No skin rash + fatigue No myalgias + headache Used OTC meds (cough suppressant and Zyrtec) without relief   Physical Exam Triage Vital Signs ED Triage Vitals  Enc Vitals Group     BP 11/20/20 1305 (!) 142/95     Pulse Rate 11/20/20 1305 95     Resp 11/20/20 1305 15     Temp 11/20/20 1305 98.6 F (37 C)     Temp Source 11/20/20 1305 Oral     SpO2 11/20/20 1305 96 %     Weight --      Height --      Head Circumference --      Peak Flow --      Pain Score 11/20/20 1304 4  Pain Loc --      Pain Edu? --      Excl. in GC? --    No data found.  Updated Vital Signs BP (!) 142/95 (BP Location: Right Arm)   Pulse 95   Temp 98.6 F (37 C) (Oral)   Resp 15   SpO2 96%   Visual Acuity Right Eye Distance:   Left Eye Distance:   Bilateral Distance:    Right Eye Near:   Left Eye Near:    Bilateral Near:     Physical Exam Nursing notes and Vital Signs reviewed. Appearance:  Patient appears stated age, and in no acute distress Eyes:  Pupils are equal, round, and reactive to light and accomodation.  Extraocular movement is intact.  Conjunctivae are not inflamed  Ears:  Canals normal.  Tympanic membranes normal.  Nose:  Congested turbinates.  Maxillary sinus tenderness is present.  Pharynx:  Normal Neck:  Supple.  Mildly enlarged lateral nodes are present, tender to palpation on the left.   Lungs:  Clear to auscultation.  Breath sounds are equal.  Moving air well. Heart:  Regular rate and rhythm  without murmurs, rubs, or gallops.  Abdomen:  Nontender without masses or hepatosplenomegaly.  Bowel sounds are present.  No CVA or flank tenderness.  Extremities:  No edema.  Skin:  No rash present.   UC Treatments / Results  Labs (all labs ordered are listed, but only abnormal results are displayed) Labs Reviewed - No data to display  EKG   Radiology No results found.  Procedures Procedures (including critical care time)  Medications Ordered in UC Medications - No data to display  Initial Impression / Assessment and Plan / UC Course  I have reviewed the triage vital signs and the nursing notes.  Pertinent labs & imaging results that were available during my care of the patient were reviewed by me and considered in my medical decision making (see chart for details).    ?maxillary sinusitis.  Begin amoxicillin. Followup with Family Doctor if not improved in 7 to 10 days.   Final Clinical Impressions(s) / UC Diagnoses   Final diagnoses:  Viral URI with cough     Discharge Instructions     Take plain guaifenesin (1200mg  extended release tabs such as Mucinex) twice daily, with plenty of water, for cough and congestion.  May add Pseudoephedrine (30mg , one or two every 4 to 6 hours) for sinus congestion.  Get adequate rest.   May use Afrin nasal spray (or generic oxymetazoline) each morning for about 5 days and then discontinue.  Also recommend using saline nasal spray several times daily and saline nasal irrigation (AYR is a common brand).  Use Flonase nasal spray each morning after using Afrin nasal spray and saline nasal irrigation. Try warm salt water gargles for sore throat.  Stop all antihistamines for now, and other non-prescription cough/cold preparations. May take Delsym Cough Suppressant ("12 Hour Cough Relief") at bedtime for nighttime cough.     ED Prescriptions    Medication Sig Dispense Auth. Provider   predniSONE (DELTASONE) 20 MG tablet Take one tab by  mouth twice daily for 4 days, then one daily for 3 days. Take with food. 11 tablet , MD   amoxicillin (AMOXIL) 875 MG tablet Take 1 tablet (875 mg total) by mouth 2 (two) times daily. 20 tablet , MD        Lattie Haw, MD 11/22/20 [phone]

## 2021-02-11 ENCOUNTER — Emergency Department
Admission: RE | Admit: 2021-02-11 | Discharge: 2021-02-11 | Disposition: A | Discharge status: Home / Self Care | Payer: Federal, State, Local not specified - PPO | Source: Ambulatory Visit | Attending: Family Medicine | Admitting: Family Medicine

## 2021-02-11 ENCOUNTER — Other Ambulatory Visit: Payer: Self-pay

## 2021-02-11 VITALS — BP 121/82 | HR 87 | Temp 98.9°F | Resp 16

## 2021-02-11 DIAGNOSIS — R197 Diarrhea, unspecified: Secondary | ICD-10-CM

## 2021-02-11 DIAGNOSIS — R55 Syncope and collapse: Secondary | ICD-10-CM

## 2021-02-11 MED ORDER — SODIUM CHLORIDE 0.9 % IV SOLN: Freq: Once | INTRAVENOUS | Status: AC

## 2021-02-11 MED ORDER — SODIUM CHLORIDE 0.9 % IV BOLUS: 1000.0000 mL | Freq: Once | INTRAVENOUS | Status: DC

## 2021-02-11 NOTE — Discharge Instructions (Addendum)
Go to ER

## 2021-02-11 NOTE — ED Triage Notes (Signed)
Patient presents to Urgent Care with complaints of abdominal pain and diarrhea since last night after eating. Patient reports her body hurts so badly it is tender to the touch. Also mentions chest pain, left arm pain, and left sided neck pain, hand pain, leg pain.  Pt denies cardiac hx, endorses nausea but has not vomited today, still having diarrhea. Has taken ibuprofen and tylenol.

## 2021-02-11 NOTE — ED Provider Notes (Signed)
[facility]    CSN: [id number] Arrival date & time: 02/11/21  1054      History   Chief Complaint Chief Complaint  Patient presents with  . Appointment    1100  . Abdominal Pain    HPI Kimberly Archer is a 40 y.o. female.   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  Here with what she thinks may be food poisoning.  She has had abdominal pain and diarrhea since last night after eating.  She has body aches.  Fatigue.  She states she is having chest pain left arm pain left-sided neck hand and leg pain as well.  She is trying to stay hydrated but it is difficult.  She has nausea but no vomiting.  Is still having diarrhea.  Has taken some ibuprofen and some Tylenol for the body aches.  Patient is vaccinated.  No known exposure to illness.  Past Medical History:  Diagnosis Date  . GERD (gastroesophageal reflux disease)     There are no problems to display for this patient.   Past Surgical History:  Procedure Laterality Date  . APPENDECTOMY      OB History   No obstetric history on file.      Home Medications    Prior to Admission medications   Medication Sig Start Date End Date Taking? Authorizing Provider  cetirizine (ZYRTEC) 10 MG tablet Take 10 mg by mouth daily.    [provider]  famotidine (PEPCID) 20 MG tablet Take 20 mg by mouth 2 (two) times daily.    [provider]  montelukast (SINGULAIR) 10 MG tablet Take 10 mg at bedtime by mouth.    [provider]  Albuterol Sulfate (PROAIR RESPICLICK) 108 (90 BASE) MCG/ACT AEPB Inhale 2 puffs into the lungs every 4 (four) hours as needed. 12/06/14 10/23/15  Sherren Mocha, MD    Family History Family History  Problem Relation Age of Onset  . Hypertension Mother   . Hypertension Father     Social History Social History   Tobacco Use  . Smoking status: Never Smoker  . Smokeless tobacco: Never Used  Vaping Use  . Vaping Use: Never used  Substance Use Topics  . Alcohol use: Yes     Comment: socially  . Drug use: No     Allergies   Patient has no known allergies.   Review of Systems Review of Systems See HPI  Physical Exam Triage Vital Signs ED Triage Vitals  Enc Vitals Group     BP 02/11/21 1103 115/76     Pulse Rate 02/11/21 1103 (!) 103     Resp 02/11/21 1103 16     Temp 02/11/21 1103 98.9 F (37.2 C)     Temp Source 02/11/21 1103 Oral     SpO2 02/11/21 1103 97 %     Weight --      Height --      Head Circumference --      Peak Flow --      Pain Score 02/11/21 1102 7     Pain Loc --      Pain Edu? --      Excl. in GC? --    No data found.  Updated Vital Signs BP 121/82 (BP Location: Left Arm)   Pulse 87   Temp 98.9 F (37.2 C) (Oral)   Resp 16   SpO2 96%      Physical Exam Constitutional:      General: She is  in acute distress.     Appearance: She is well-developed. She is ill-appearing.     Comments: Patient had an ill appearance upon arrival.  While being roomed and vital signs completed, she had a syncopal episode and was unconscious for 5 to 10 seconds before spontaneously awakening with sternal rub  HENT:     Head: Normocephalic and atraumatic.  Eyes:     Conjunctiva/sclera: Conjunctivae normal.     Pupils: Pupils are equal, round, and reactive to light.  Cardiovascular:     Rate and Rhythm: Normal rate and regular rhythm.     Heart sounds: Normal heart sounds.  Pulmonary:     Effort: Pulmonary effort is normal. No respiratory distress.     Breath sounds: Normal breath sounds.  Abdominal:     General: There is distension.     Palpations: Abdomen is soft.     Comments: Diffuse tenderness abdomen, moderate, no guarding or rebound  Musculoskeletal:        General: Normal range of motion.     Cervical back: Normal range of motion.  Skin:    General: Skin is warm and dry.  Psychiatric:        Behavior: Behavior normal.   Patient was observed during syncopal spell.  She was quite pale.  Upon awakening she is fully alert.   No seizure activity.   UC Treatments / Results  Labs (all labs ordered are listed, but only abnormal results are displayed) Labs Reviewed - No data to display  EKG   Radiology No results found.  Procedures Procedures (including critical care time)  Medications Ordered in UC Medications  0.9 %  sodium chloride infusion ( Intravenous Transfusing/Transfer 02/11/21 1131)    Initial Impression / Assessment and Plan / UC Course  I have reviewed the triage vital signs and the nursing notes.  Pertinent labs & imaging results that were available during my care of the patient were reviewed by me and considered in my medical decision making (see chart for details).     Final Clinical Impressions(s) / UC Diagnoses   Final diagnoses:  Syncope, unspecified syncope type  Diarrhea, unspecified type     Discharge Instructions     Go to ER    ED Prescriptions    None     PDMP not reviewed this encounter.   Eustace Moore, MD 02/11/21 1230

## 2021-02-11 NOTE — ED Notes (Signed)
While attaching the EKG leads, the patient stated that she felt as though she might pass out. Head of bed declined for comfort. Patient then proceeded to become unconscious for approx 5 seconds, became bradycardic but pulses remained palpable. This RN tapped and shouted at patient, no response. This RN then sternal rubbed the patient and she came to. MD bedside, recommending pt transport to ED via EMS for further evaluation. Verbal order received to initiate IV and begin NS infusion. Pt A&O at this time, verbalized understanding of plan of care.  Patient is being discharged from the Urgent Care and sent to the Emergency Department via EMS . Per Dr. Delton See, patient is in need of higher level of care due to need for electrolyte replacement and possible evaluation of syncopal episode. Patient is aware and verbalizes understanding of plan of care. Patent discharged with NS infusing via 20G IV in right AC. Vitals:   02/11/21 1103 02/11/21 1115  BP: 115/76 121/82  Pulse: (!) 103 87  Resp: 16   Temp: 98.9 F (37.2 C)   SpO2: 97% 96%

## 2021-02-11 NOTE — ED Provider Notes (Signed)
[facility]    CSN: [id number] Arrival date & time: 02/11/21  1054      History   Chief Complaint Chief Complaint  Patient presents with  . Appointment    1100  . Abdominal Pain    HPI Kimberly Archer is a 40 y.o. female.    Abdominal Pain   Past Medical History:  Diagnosis Date  . GERD (gastroesophageal reflux disease)     There are no problems to display for this patient.   Past Surgical History:  Procedure Laterality Date  . APPENDECTOMY      OB History   No obstetric history on file.      Home Medications    Prior to Admission medications   Medication Sig Start Date End Date Taking? Authorizing Provider  amoxicillin (AMOXIL) 875 MG tablet Take 1 tablet (875 mg total) by mouth 2 (two) times daily. 11/20/20   Lattie Haw, MD  cetirizine (ZYRTEC) 10 MG tablet Take 10 mg by mouth daily.    [provider]  famotidine (PEPCID) 20 MG tablet Take 20 mg by mouth 2 (two) times daily.    [provider]  montelukast (SINGULAIR) 10 MG tablet Take 10 mg at bedtime by mouth.    [provider]  predniSONE (DELTASONE) 20 MG tablet Take one tab by mouth twice daily for 4 days, then one daily for 3 days. Take with food. 11/20/20   Lattie Haw, MD  Albuterol Sulfate (PROAIR RESPICLICK) 108 (90 BASE) MCG/ACT AEPB Inhale 2 puffs into the lungs every 4 (four) hours as needed. 12/06/14 10/23/15  Sherren Mocha, MD    Family History Family History  Problem Relation Age of Onset  . Hypertension Mother   . Hypertension Father     Social History Social History   Tobacco Use  . Smoking status: Never Smoker  . Smokeless tobacco: Never Used  Vaping Use  . Vaping Use: Never used  Substance Use Topics  . Alcohol use: Yes    Comment: socially  . Drug use: No     Allergies   Patient has no known allergies.   Review of Systems Review of Systems  Gastrointestinal: Positive for abdominal pain.     Physical Exam Triage  Vital Signs ED Triage Vitals  Enc Vitals Group     BP 02/11/21 1103 115/76     Pulse Rate 02/11/21 1103 (!) 103     Resp 02/11/21 1103 16     Temp 02/11/21 1103 98.9 F (37.2 C)     Temp Source 02/11/21 1103 Oral     SpO2 02/11/21 1103 97 %     Weight --      Height --      Head Circumference --      Peak Flow --      Pain Score 02/11/21 1102 7     Pain Loc --      Pain Edu? --      Excl. in GC? --    No data found.  Updated Vital Signs BP 121/82 (BP Location: Left Arm)   Pulse 87   Temp 98.9 F (37.2 C) (Oral)   Resp 16   SpO2 96%   Visual Acuity Right Eye Distance:   Left Eye Distance:   Bilateral Distance:    Right Eye Near:   Left Eye Near:    Bilateral Near:     Physical Exam   UC Treatments / Results  Labs (all labs  ordered are listed, but only abnormal results are displayed) Labs Reviewed - No data to display  EKG   Radiology No results found.  Procedures ED EKG  Date/Time: 02/11/2021 11:34 AM Performed by: Trevor Iha, FNP Authorized by: Eustace Moore, MD     (including critical care time)     Trevor Iha, [facility] 02/11/21 1746

## 2021-08-04 ENCOUNTER — Emergency Department (INDEPENDENT_AMBULATORY_CARE_PROVIDER_SITE_OTHER): Payer: Federal, State, Local not specified - PPO

## 2021-08-04 ENCOUNTER — Emergency Department
Admission: RE | Admit: 2021-08-04 | Discharge: 2021-08-04 | Disposition: A | Discharge status: Home / Self Care | Payer: Federal, State, Local not specified - PPO | Source: Ambulatory Visit

## 2021-08-04 ENCOUNTER — Other Ambulatory Visit: Payer: Self-pay

## 2021-08-04 VITALS — BP 126/84 | HR 79 | Temp 98.9°F | Ht 65.0 in | Wt 200.0 lb

## 2021-08-04 DIAGNOSIS — W133XXA Fall through floor, initial encounter: Secondary | ICD-10-CM

## 2021-08-04 DIAGNOSIS — M25561 Pain in right knee: Secondary | ICD-10-CM | POA: Diagnosis not present

## 2021-08-04 MED ORDER — PREDNISONE 20 MG PO TABS: ORAL_TABLET | ORAL | 0 refills | Status: DC

## 2021-08-04 MED ORDER — BACLOFEN 10 MG PO TABS: 10.0000 mg | ORAL_TABLET | Freq: Three times a day (TID) | ORAL | 0 refills | Status: DC

## 2021-08-04 NOTE — ED Provider Notes (Signed)
[facility]    CSN: [id number] Arrival date & time: 08/04/21  1130      History   Chief Complaint Chief Complaint  Patient presents with   Appointment   Knee Pain    HPI Kimberly Archer is a 40 y.o. female.   HPI 40 year old female presents with right knee problems.  Reports falling through attic on 07/21/21.  Patient reports right knee pain has burning pain.  Patient reports taking OTC Tylenol and ibuprofen with some relief.  Patient reports bruising of right knee as well.  Past Medical History:  Diagnosis Date   GERD (gastroesophageal reflux disease)     There are no problems to display for this patient.   Past Surgical History:  Procedure Laterality Date   APPENDECTOMY      OB History   No obstetric history on file.      Home Medications    Prior to Admission medications   Medication Sig Start Date End Date Taking? Authorizing Provider  baclofen (LIORESAL) 10 MG tablet Take 1 tablet (10 mg total) by mouth 3 (three) times daily. 08/04/21  Yes Trevor Iha, FNP  cetirizine (ZYRTEC) 10 MG tablet Take 10 mg by mouth daily.   Yes [provider]  montelukast (SINGULAIR) 10 MG tablet Take 10 mg at bedtime by mouth.   Yes [provider]  predniSONE (DELTASONE) 20 MG tablet Take 3 tabs PO daily x 3 days, then 2 tabs PO daily x 3 days, then 1 tab PO daily x 3 days 08/04/21  Yes Trevor Iha, FNP  famotidine (PEPCID) 20 MG tablet Take 20 mg by mouth 2 (two) times daily.    [provider]  Albuterol Sulfate (PROAIR RESPICLICK) 108 (90 BASE) MCG/ACT AEPB Inhale 2 puffs into the lungs every 4 (four) hours as needed. 12/06/14 10/23/15  Sherren Mocha, MD    Family History Family History  Problem Relation Age of Onset   Hypertension Mother    Hypertension Father     Social History Social History   Tobacco Use   Smoking status: Never   Smokeless tobacco: Never  Vaping Use   Vaping Use: Never used  Substance Use Topics    Alcohol use: Yes    Comment: socially   Drug use: No     Allergies   Patient has no known allergies.   Review of Systems Review of Systems  Musculoskeletal:        Right knee pain x 2 weeks secondary to injury  All other systems reviewed and are negative.   Physical Exam Triage Vital Signs ED Triage Vitals  Enc Vitals Group     BP 08/04/21 1215 126/84     Pulse Rate 08/04/21 1215 79     Resp --      Temp 08/04/21 1215 98.9 F (37.2 C)     Temp Source 08/04/21 1215 Oral     SpO2 08/04/21 1215 98 %     Weight 08/04/21 1217 200 lb (90.7 kg)     Height 08/04/21 1217 5\' 5"  (1.651 m)     Head Circumference --      Peak Flow --      Pain Score 08/04/21 1217 2     Pain Loc --      Pain Edu? --      Excl. in GC? --    No data found.  Updated Vital Signs BP 126/84 (BP Location: Right Arm)   Pulse 79   Temp  98.9 F (37.2 C) (Oral)   Ht 5\' 5"  (1.651 m)   Wt 200 lb (90.7 kg)   SpO2 98%   BMI 33.28 kg/m      Physical Exam Vitals and nursing note reviewed.  Constitutional:      General: She is not in acute distress.    Appearance: She is obese. She is not ill-appearing.  HENT:     Head: Normocephalic and atraumatic.     Mouth/Throat:     Mouth: Mucous membranes are moist.     Pharynx: Oropharynx is clear.  Eyes:     Extraocular Movements: Extraocular movements intact.     Conjunctiva/sclera: Conjunctivae normal.     Pupils: Pupils are equal, round, and reactive to light.  Cardiovascular:     Rate and Rhythm: Normal rate and regular rhythm.     Pulses: Normal pulses.     Heart sounds: Normal heart sounds.  Pulmonary:     Effort: Pulmonary effort is normal.     Breath sounds: Normal breath sounds.  Musculoskeletal:        General: Normal range of motion.     Cervical back: Normal range of motion and neck supple.     Comments: Right knee (anterior aspect): TTP over medial inferior border, with soft tissue swelling noted limited range of motion with  flexion/extension, positive Lachman's, positive anterior drawer.  Skin:    General: Skin is warm and dry.  Neurological:     General: No focal deficit present.     Mental Status: She is alert and oriented to person, place, and time. Mental status is at baseline.  Psychiatric:        Mood and Affect: Mood normal.        Behavior: Behavior normal.        Thought Content: Thought content normal.     UC Treatments / Results  Labs (all labs ordered are listed, but only abnormal results are displayed) Labs Reviewed - No data to display  EKG   Radiology DG Knee Complete 4 Views Right  Result Date: 08/04/2021 CLINICAL DATA:  Right knee pain after injury on 07/21/2021 EXAM: RIGHT KNEE - COMPLETE 4+ VIEW COMPARISON:  None. FINDINGS: No evidence of fracture, dislocation, or joint effusion. No evidence of arthropathy or other focal bone abnormality. Soft tissues are unremarkable. IMPRESSION: Negative. Electronically Signed   By: 09/20/2021 D.O.   On: 08/04/2021 13:44    Procedures Procedures (including critical care time)  Medications Ordered in UC Medications - No data to display  Initial Impression / Assessment and Plan / UC Course  I have reviewed the triage vital signs and the nursing notes.  Pertinent labs & imaging results that were available during my care of the patient were reviewed by me and considered in my medical decision making (see chart for details).     MDM: 1.  Right knee pain-x-ray right knee reveals (above), Rx'd Prednisone taper and Baclofen, patient needed to leave early to pick up children and would like results called into her. Advised patient to wear right knee brace 24/7 until meeting with orthopedic.  Advised patient to take medication as directed with food to completion.  Advised patient may use baclofen daily, as needed.  [facility] orthopedic provider contact information provided above. Advised/encouraged patient if she has a brace at home to please  use until evaluation with orthopedic provider.  Advised patient to please call to set up an appointment.  Patient discharged home, hemodynamically stable.  Final Clinical Impressions(s) / UC Diagnoses   Final diagnoses:  Acute pain of right knee     Discharge Instructions      Advised patient to wear right knee brace 24/7 until meeting with orthopedic.  Advised patient to take medication as directed with food to completion.  Advised patient may use baclofen daily, as needed.  [facility] orthopedic provider contact information provided above.  Advised patient to please call to set up an appointment.     ED Prescriptions     Medication Sig Dispense Auth. Provider   predniSONE (DELTASONE) 20 MG tablet Take 3 tabs PO daily x 3 days, then 2 tabs PO daily x 3 days, then 1 tab PO daily x 3 days 18 tablet Trevor Iha, FNP   baclofen (LIORESAL) 10 MG tablet Take 1 tablet (10 mg total) by mouth 3 (three) times daily. 30 each Trevor Iha, FNP      PDMP not reviewed this encounter.   Trevor Iha, FNP 08/04/21 1402

## 2021-08-04 NOTE — Discharge Instructions (Addendum)
Advised patient to wear right knee brace 24/7 until meeting with orthopedic.  Advised patient to take medication as directed with food to completion.  Advised patient may use baclofen daily, as needed.  [facility] orthopedic provider contact information provided above.  Advised patient to please call to set up an appointment.

## 2021-08-04 NOTE — ED Triage Notes (Signed)
Patient states that she fell through an attic on 07/21/2021.  She is having right knee/leg problems.  Pain described as a "burning pain".  Patient has taken Tylenol and Ibuprofen.  The knee does have some bruising on it.

## 2021-08-17 ENCOUNTER — Ambulatory Visit: Payer: Self-pay

## 2021-08-17 ENCOUNTER — Ambulatory Visit: Payer: Federal, State, Local not specified - PPO | Admitting: Family Medicine

## 2021-08-17 VITALS — BP 110/80 | Ht 65.0 in | Wt 200.0 lb

## 2021-08-17 DIAGNOSIS — S83411A Sprain of medial collateral ligament of right knee, initial encounter: Secondary | ICD-10-CM | POA: Insufficient documentation

## 2021-08-17 DIAGNOSIS — M25561 Pain in right knee: Secondary | ICD-10-CM

## 2021-08-17 NOTE — Progress Notes (Signed)
  Kimberly Archer - 40 y.o. female MRN [id number]  Date of birth: Aug 02, 1981  SUBJECTIVE:  Including CC & ROS.  No chief complaint on file.   Kimberly Archer is a 40 y.o. female that is presenting with right knee pain.  She had a fall a few weeks ago when she fell through the attic.  Independent review of the right knee x-ray from 10/18 shows no acute changes.   Review of Systems See HPI   HISTORY: Past Medical, Surgical, Social, and Family History Reviewed & Updated per EMR.   Pertinent Historical Findings include:  Past Medical History:  Diagnosis Date   GERD (gastroesophageal reflux disease)     Past Surgical History:  Procedure Laterality Date   APPENDECTOMY      Family History  Problem Relation Age of Onset   Hypertension Mother    Hypertension Father     Social History   Socioeconomic History   Marital status: Single    Spouse name: Not on file   Number of children: Not on file   Years of education: Not on file   Highest education level: Not on file  Occupational History   Not on file  Tobacco Use   Smoking status: Never   Smokeless tobacco: Never  Vaping Use   Vaping Use: Never used  Substance and Sexual Activity   Alcohol use: Yes    Comment: socially   Drug use: No   Sexual activity: Not on file  Other Topics Concern   Not on file  Social History Narrative   Not on file   Social Determinants of Health   Financial Resource Strain: Not on file  Food Insecurity: Not on file  Transportation Needs: Not on file  Physical Activity: Not on file  Stress: Not on file  Social Connections: Not on file  Intimate Partner Violence: Not on file     PHYSICAL EXAM:  VS: BP 110/80   Ht 5\' 5"  (1.651 m)   Wt 200 lb (90.7 kg)   BMI 33.28 kg/m  Physical Exam Gen: NAD, alert, cooperative with exam, well-appearing   Limited ultrasound: Right knee:  No effusion suprapatellar pouch. Normal-appearing quadricep patellar tendon. Swelling and increased  hyperemia over the origin of the MCL. Normal-appearing lateral joint space  Summary: MCL sprain  Ultrasound and interpretation by , MD     ASSESSMENT & PLAN:   Sprain of medial collateral ligament of right knee Initial injury on 10/4 and has changes at the origin of the MCL -Counseled on home exercise therapy and supportive care. -Hinged knee brace. -Could consider physical therapy

## 2021-08-17 NOTE — Assessment & Plan Note (Signed)
Initial injury on 10/4 and has changes at the origin of the MCL -Counseled on home exercise therapy and supportive care. -Hinged knee brace. -Could consider physical therapy

## 2021-08-17 NOTE — Patient Instructions (Signed)
Nice to meet you Please use the brace  Please use ice as needed  Please try the exercises when the pain improves   Please send me a message in MyChart with any questions or updates.  Please see me back in 2-3 weeks.   --Dr. Jordan Likes

## 2021-08-31 ENCOUNTER — Ambulatory Visit: Payer: Federal, State, Local not specified - PPO | Admitting: Family Medicine

## 2021-08-31 ENCOUNTER — Encounter: Payer: Self-pay | Admitting: Family Medicine

## 2021-08-31 DIAGNOSIS — S83411D Sprain of medial collateral ligament of right knee, subsequent encounter: Secondary | ICD-10-CM

## 2021-08-31 NOTE — Assessment & Plan Note (Signed)
Initial injury on 10/4. Has been well with the brace.  - counseled on home exercise therapy and supportive care - continue brace - could consider physical therapy.

## 2021-08-31 NOTE — Progress Notes (Signed)
  Kimberly Archer - 40 y.o. female MRN [id number]  Date of birth: 09-11-1981  SUBJECTIVE:  Including CC & ROS.  No chief complaint on file.   Kimberly Archer is a 40 y.o. female that is following up for her right knee pain.  She feels no pain if she wears the hinged knee brace.  Does have slight pain without the brace.    Review of Systems See HPI   HISTORY: Past Medical, Surgical, Social, and Family History Reviewed & Updated per EMR.   Pertinent Historical Findings include:  Past Medical History:  Diagnosis Date   GERD (gastroesophageal reflux disease)     Past Surgical History:  Procedure Laterality Date   APPENDECTOMY      Family History  Problem Relation Age of Onset   Hypertension Mother    Hypertension Father     Social History   Socioeconomic History   Marital status: Single    Spouse name: Not on file   Number of children: Not on file   Years of education: Not on file   Highest education level: Not on file  Occupational History   Not on file  Tobacco Use   Smoking status: Never   Smokeless tobacco: Never  Vaping Use   Vaping Use: Never used  Substance and Sexual Activity   Alcohol use: Yes    Comment: socially   Drug use: No   Sexual activity: Not on file  Other Topics Concern   Not on file  Social History Narrative   Not on file   Social Determinants of Health   Financial Resource Strain: Not on file  Food Insecurity: Not on file  Transportation Needs: Not on file  Physical Activity: Not on file  Stress: Not on file  Social Connections: Not on file  Intimate Partner Violence: Not on file     PHYSICAL EXAM:  VS: BP 130/80 (BP Location: Left Arm, Patient Position: Sitting)   Ht 5\' 5"  (1.651 m)   Wt 200 lb (90.7 kg)   BMI 33.28 kg/m  Physical Exam Gen: NAD, alert, cooperative with exam, well-appearing    ASSESSMENT & PLAN:   Sprain of medial collateral ligament of right knee Initial injury on 10/4. Has been well with the brace.  -  counseled on home exercise therapy and supportive care - continue brace - could consider physical therapy.

## 2021-08-31 NOTE — Patient Instructions (Signed)
Good to see you Please use the brace  Please use ice as needed  Please continue the exercises   Please send me a message in MyChart with any questions or updates.  Please see me back in 4 weeks.   --Dr. Jordan Likes

## 2021-12-14 ENCOUNTER — Emergency Department
Admission: EM | Admit: 2021-12-14 | Discharge: 2021-12-14 | Disposition: A | Discharge status: Home / Self Care | Payer: Federal, State, Local not specified - PPO | Source: Home / Self Care | Attending: Family Medicine | Admitting: Family Medicine

## 2021-12-14 ENCOUNTER — Other Ambulatory Visit: Payer: Self-pay

## 2021-12-14 DIAGNOSIS — B349 Viral infection, unspecified: Secondary | ICD-10-CM

## 2021-12-14 LAB — POCT INFLUENZA A/B
Influenza A, POC: NEGATIVE
Influenza B, POC: NEGATIVE

## 2021-12-14 LAB — POC SARS CORONAVIRUS 2 AG -  ED: SARS Coronavirus 2 Ag: NEGATIVE

## 2021-12-14 NOTE — Discharge Instructions (Signed)
Drink lots of water May continue over-the-counter cough and cold medicines Flonase twice a day for 2 to 3 days then once a day until symptoms are gone is advised

## 2021-12-14 NOTE — ED Triage Notes (Signed)
Pt states that she has some nausea, body aches, sore throat, headache, and nasal congestion. X5 days   Pt states that she is vaccinated for covid. Pt states that she hasn't had her flu vaccine.

## 2021-12-14 NOTE — ED Provider Notes (Signed)
[facility]    CSN: [id number] Arrival date & time: 12/14/21  0813      History   Chief Complaint Chief Complaint  Patient presents with   Nausea    Nausea, body aches, sore throat, headache, and nasal congestion. X5 days    HPI Kimberly Archer is a 41 y.o. female.   HPI\patient works as a Therapist, music.  She states that she was exposed to a fair amount of illness.  She currently has runny nose, stuffy nose, sore throat, nausea and diarrhea as well as some cough.  Symptoms been going on for 5 days.  She also has body aches.  She is worried about going back to work today with illness  Past Medical History:  Diagnosis Date   GERD (gastroesophageal reflux disease)     Patient Active Problem List   Diagnosis Date Noted   Sprain of medial collateral ligament of right knee 08/17/2021    Past Surgical History:  Procedure Laterality Date   APPENDECTOMY      OB History   No obstetric history on file.      Home Medications    Prior to Admission medications   Medication Sig Start Date End Date Taking? Authorizing Provider  cetirizine (ZYRTEC) 10 MG tablet Take 10 mg by mouth daily.   Yes [provider]  montelukast (SINGULAIR) 10 MG tablet Take 10 mg at bedtime by mouth.   Yes [provider]  Albuterol Sulfate (PROAIR RESPICLICK) 108 (90 BASE) MCG/ACT AEPB Inhale 2 puffs into the lungs every 4 (four) hours as needed. 12/06/14 10/23/15  Sherren Mocha, MD    Family History Family History  Problem Relation Age of Onset   Hypertension Mother    Hypertension Father     Social History Social History   Tobacco Use   Smoking status: Never   Smokeless tobacco: Never  Vaping Use   Vaping Use: Never used  Substance Use Topics   Alcohol use: Yes    Comment: socially   Drug use: No     Allergies   Patient has no known allergies.   Review of Systems Review of Systems See HPI  Physical Exam Triage Vital Signs ED Triage Vitals  Enc  Vitals Group     BP 12/14/21 0829 117/84     Pulse Rate 12/14/21 0829 86     Resp 12/14/21 0829 20     Temp 12/14/21 0829 98.9 F (37.2 C)     Temp Source 12/14/21 0829 Oral     SpO2 12/14/21 0829 97 %     Weight 12/14/21 0827 200 lb (90.7 kg)     Height 12/14/21 0827 5\' 5"  (1.651 m)     Head Circumference --      Peak Flow --      Pain Score 12/14/21 0827 5     Pain Loc --      Pain Edu? --      Excl. in GC? --    No data found.  Updated Vital Signs BP 117/84 (BP Location: Right Arm)    Pulse 86    Temp 98.9 F (37.2 C) (Oral)    Resp 20    Ht 5\' 5"  (1.651 m)    Wt 90.7 kg    SpO2 97%    BMI 33.28 kg/m      Physical Exam Constitutional:      General: She is not in acute distress.    Appearance: She is well-developed.  She is ill-appearing.  HENT:     Head: Normocephalic and atraumatic.     Right Ear: Tympanic membrane and ear canal normal.     Left Ear: Tympanic membrane and ear canal normal.     Nose: Congestion present.     Mouth/Throat:     Pharynx: Posterior oropharyngeal erythema present.  Eyes:     Conjunctiva/sclera: Conjunctivae normal.     Pupils: Pupils are equal, round, and reactive to light.  Cardiovascular:     Rate and Rhythm: Normal rate and regular rhythm.     Heart sounds: Normal heart sounds.  Pulmonary:     Effort: Pulmonary effort is normal. No respiratory distress.  Abdominal:     General: There is no distension.     Palpations: Abdomen is soft.  Musculoskeletal:        General: Normal range of motion.     Cervical back: Normal range of motion.  Lymphadenopathy:     Cervical: No cervical adenopathy.  Skin:    General: Skin is warm and dry.  Neurological:     Mental Status: She is alert.     UC Treatments / Results  Labs (all labs ordered are listed, but only abnormal results are displayed) Labs Reviewed  POCT INFLUENZA A/B - Normal  POC SARS CORONAVIRUS 2 AG -  ED - Normal    EKG   Radiology No results  found.  Procedures Procedures (including critical care time)  Medications Ordered in UC Medications - No data to display  Initial Impression / Assessment and Plan / UC Course  I have reviewed the triage vital signs and the nursing notes.  Pertinent labs & imaging results that were available during my care of the patient were reviewed by me and considered in my medical decision making (see chart for details).     Viral upper respiratory infection Final Clinical Impressions(s) / UC Diagnoses   Final diagnoses:  Viral illness     Discharge Instructions      Drink lots of water May continue over-the-counter cough and cold medicines Flonase twice a day for 2 to 3 days then once a day until symptoms are gone is advised     ED Prescriptions   None    PDMP not reviewed this encounter.   Eustace Moore, MD 12/14/21 [phone]

## 2022-08-30 ENCOUNTER — Ambulatory Visit
Admission: RE | Admit: 2022-08-30 | Discharge: 2022-08-30 | Disposition: A | Discharge status: Home / Self Care | Payer: Federal, State, Local not specified - PPO | Source: Ambulatory Visit

## 2022-08-30 VITALS — BP 134/89 | HR 81 | Temp 98.5°F | Resp 18

## 2022-08-30 DIAGNOSIS — J0100 Acute maxillary sinusitis, unspecified: Secondary | ICD-10-CM | POA: Diagnosis not present

## 2022-08-30 DIAGNOSIS — J309 Allergic rhinitis, unspecified: Secondary | ICD-10-CM | POA: Diagnosis not present

## 2022-08-30 DIAGNOSIS — J3489 Other specified disorders of nose and nasal sinuses: Secondary | ICD-10-CM | POA: Diagnosis not present

## 2022-08-30 MED ORDER — PREDNISONE 10 MG (21) PO TBPK: ORAL_TABLET | Freq: Every day | ORAL | 0 refills | Status: DC

## 2022-08-30 MED ORDER — AMOXICILLIN 875 MG PO TABS: 875.0000 mg | ORAL_TABLET | Freq: Two times a day (BID) | ORAL | 0 refills | Status: AC

## 2022-08-30 MED ORDER — FEXOFENADINE HCL 180 MG PO TABS: 180.0000 mg | ORAL_TABLET | Freq: Every day | ORAL | 0 refills | Status: DC

## 2022-08-30 NOTE — ED Provider Notes (Signed)
[facility]    CSN: [id number] Arrival date & time: 08/30/22  1152      History   Chief Complaint Chief Complaint  Patient presents with   Facial Pain   Nasal Congestion    HPI Kimberly Archer is a 41 y.o. female.   HPI 41 year old female presents with sinus nasal congestion and facial pain for 1 month.  Reports left ear pain started on Saturday.  Reports has tried OTC Flonase and allergy medicines with sinus medicine with little relief.  PMH significant for obesity and GERD.  Past Medical History:  Diagnosis Date   GERD (gastroesophageal reflux disease)     Patient Active Problem List   Diagnosis Date Noted   Sprain of medial collateral ligament of right knee 08/17/2021    Past Surgical History:  Procedure Laterality Date   APPENDECTOMY      OB History   No obstetric history on file.      Home Medications    Prior to Admission medications   Medication Sig Start Date End Date Taking? Authorizing Provider  amoxicillin (AMOXIL) 875 MG tablet Take 1 tablet (875 mg total) by mouth 2 (two) times daily for 10 days. 08/30/22 09/09/22 Yes Trevor Iha, FNP  fexofenadine [facility] ALLERGY) 180 MG tablet Take 1 tablet (180 mg total) by mouth daily for 15 days. 08/30/22 09/14/22 Yes Trevor Iha, FNP  hydrOXYzine (ATARAX) 10 MG tablet Take 10 mg by mouth daily. 08/19/22  Yes [provider]  predniSONE (STERAPRED UNI-PAK 21 TAB) 10 MG (21) TBPK tablet Take by mouth daily. Take 6 tabs by mouth daily  for 2 days, then 5 tabs for 2 days, then 4 tabs for 2 days, then 3 tabs for 2 days, 2 tabs for 2 days, then 1 tab by mouth daily for 2 days 08/30/22  Yes Trevor Iha, FNP  cetirizine (ZYRTEC) 10 MG tablet Take 10 mg by mouth daily.    [provider]  montelukast (SINGULAIR) 10 MG tablet Take 10 mg at bedtime by mouth.    [provider]  Albuterol Sulfate (PROAIR RESPICLICK) 108 (90 BASE) MCG/ACT AEPB Inhale 2 puffs into the  lungs every 4 (four) hours as needed. 12/06/14 10/23/15  Sherren Mocha, MD    Family History Family History  Problem Relation Age of Onset   Hypertension Mother    Hypertension Father     Social History Social History   Tobacco Use   Smoking status: Never   Smokeless tobacco: Never  Vaping Use   Vaping Use: Never used  Substance Use Topics   Alcohol use: Yes    Comment: socially   Drug use: No     Allergies   Patient has no known allergies.   Review of Systems Review of Systems  HENT:  Positive for congestion, postnasal drip, sinus pressure and sinus pain.   All other systems reviewed and are negative.    Physical Exam Triage Vital Signs ED Triage Vitals [08/30/22 1206]  Enc Vitals Group     BP 134/89     Pulse Rate 81     Resp 18     Temp 98.5 F (36.9 C)     Temp Source Oral     SpO2 98 %     Weight      Height      Head Circumference      Peak Flow      Pain Score 3     Pain Loc  Pain Edu?      Excl. in GC?    No data found.  Updated Vital Signs BP 134/89 (BP Location: Right Arm)   Pulse 81   Temp 98.5 F (36.9 C) (Oral)   Resp 18   SpO2 98%       Physical Exam Vitals and nursing note reviewed.  Constitutional:      Appearance: Normal appearance. She is obese.  HENT:     Head: Normocephalic and atraumatic.     Right Ear: Tympanic membrane and external ear normal.     Left Ear: Tympanic membrane and external ear normal.     Ears:     Comments: Significant eustachian tube dysfunction noted bilaterally    Nose:     Right Sinus: Maxillary sinus tenderness present.     Left Sinus: Maxillary sinus tenderness present.     Mouth/Throat:     Mouth: Mucous membranes are moist.     Pharynx: Oropharynx is clear.     Comments: Significant amount of clear drainage of posterior oropharynx  Eyes:     Extraocular Movements: Extraocular movements intact.     Conjunctiva/sclera: Conjunctivae normal.     Pupils: Pupils are equal, round, and  reactive to light.  Cardiovascular:     Rate and Rhythm: Normal rate and regular rhythm.     Pulses: Normal pulses.     Heart sounds: Normal heart sounds. No murmur heard. Pulmonary:     Effort: Pulmonary effort is normal.     Breath sounds: Normal breath sounds. No wheezing, rhonchi or rales.  Musculoskeletal:        General: Normal range of motion.     Cervical back: Normal range of motion and neck supple.  Skin:    General: Skin is warm and dry.  Neurological:     General: No focal deficit present.     Mental Status: She is alert and oriented to person, place, and time.      UC Treatments / Results  Labs (all labs ordered are listed, but only abnormal results are displayed) Labs Reviewed - No data to display  EKG   Radiology No results found.  Procedures Procedures (including critical care time)  Medications Ordered in UC Medications - No data to display  Initial Impression / Assessment and Plan / UC Course  I have reviewed the triage vital signs and the nursing notes.  Pertinent labs & imaging results that were available during my care of the patient were reviewed by me and considered in my medical decision making (see chart for details).     MDM: 1.  Acute maxillary sinusitis, recurrence not specified-Rx'd amoxicillin; 2.  Sinus pressure-Rx'd Sterapred Unipak; 3.  Allergic rhinitis-Rx'd Allegra. Instructed patient to discontinue OTC Zyrtec and loratadine. Advised patient to take medications as directed with food to completion.  Advised patient to take Sterapred Unipak and Allegra with first dose of amoxicillin for the next 10 days.  Advised may use Allegra as needed for concurrent postnasal drainage/drip.  Encouraged patient to increase daily water intake to 64 ounces per day while taking these medications.  If symptoms worsen and/or unresolved please follow-up PCP or here for further evaluation. Final Clinical Impressions(s) / UC Diagnoses   Final diagnoses:   Acute maxillary sinusitis, recurrence not specified  Sinus pressure  Allergic rhinitis, unspecified seasonality, unspecified trigger     Discharge Instructions      Instructed patient to discontinue OTC Zyrtec and loratadine. Advised patient to take medications as  directed with food to completion.  Advised patient to take Sterapred Unipak and Allegra with first dose of amoxicillin for the next 10 days.  Advised may use Allegra as needed for concurrent postnasal drainage/drip.  Encouraged patient to increase daily water intake to 64 ounces per day while taking these medications.  If symptoms worsen and/or unresolved please follow-up PCP or here for further evaluation.     ED Prescriptions     Medication Sig Dispense Auth. Provider   amoxicillin (AMOXIL) 875 MG tablet Take 1 tablet (875 mg total) by mouth 2 (two) times daily for 10 days. 20 tablet Trevor Iha, FNP   predniSONE (STERAPRED UNI-PAK 21 TAB) 10 MG (21) TBPK tablet Take by mouth daily. Take 6 tabs by mouth daily  for 2 days, then 5 tabs for 2 days, then 4 tabs for 2 days, then 3 tabs for 2 days, 2 tabs for 2 days, then 1 tab by mouth daily for 2 days 42 tablet Trevor Iha, FNP   fexofenadine [facility] ALLERGY) 180 MG tablet Take 1 tablet (180 mg total) by mouth daily for 15 days. 15 tablet Trevor Iha, FNP      PDMP not reviewed this encounter.   Trevor Iha, FNP 08/30/22 1244

## 2022-08-30 NOTE — Discharge Instructions (Addendum)
Instructed patient to discontinue OTC Zyrtec and loratadine. Advised patient to take medications as directed with food to completion.  Advised patient to take Sterapred Unipak and Allegra with first dose of amoxicillin for the next 10 days.  Advised may use Allegra as needed for concurrent postnasal drainage/drip.  Encouraged patient to increase daily water intake to 64 ounces per day while taking these medications.  If symptoms worsen and/or unresolved please follow-up PCP or here for further evaluation.

## 2022-08-30 NOTE — ED Triage Notes (Signed)
Pt c/o sinus congestion and facial pain x 1 month. LT ear pain started Saturday. Has tried flonase, allergy meds and other OTC sinus meds w no relief. Neg covid. Denies fever.

## 2022-08-31 ENCOUNTER — Telehealth: Payer: Self-pay | Admitting: Emergency Medicine

## 2022-08-31 NOTE — Telephone Encounter (Signed)
Follow up call to see how Malan ws today - no answer - voice mail left w/ call back # fo rany questions or concerns

## 2022-09-11 ENCOUNTER — Ambulatory Visit
Admission: RE | Admit: 2022-09-11 | Discharge: 2022-09-11 | Disposition: A | Discharge status: Home / Self Care | Payer: Federal, State, Local not specified - PPO | Source: Ambulatory Visit | Attending: Family Medicine | Admitting: Family Medicine

## 2022-09-11 VITALS — BP 154/104 | HR 108 | Temp 97.8°F | Resp 20 | Wt 215.0 lb

## 2022-09-11 DIAGNOSIS — J101 Influenza due to other identified influenza virus with other respiratory manifestations: Secondary | ICD-10-CM

## 2022-09-11 LAB — POCT INFLUENZA A/B
Influenza A, POC: NEGATIVE
Influenza B, POC: POSITIVE — AB

## 2022-09-11 MED ORDER — OSELTAMIVIR PHOSPHATE 75 MG PO CAPS: 75.0000 mg | ORAL_CAPSULE | Freq: Two times a day (BID) | ORAL | 0 refills | Status: DC

## 2022-09-11 NOTE — ED Triage Notes (Signed)
Pt reports she was treated for a uri 2 weeks ago. This Wednesday woke up with ear pain,sore throat cough and congestion. She covid tested 3 times all negative. Took theraflu, tylenol, ibuprofen, which provided slight relief.  Pt says she feels like her heart is racing. Her bp was 140/100. And her heart rate wouldn't go down yesterday

## 2022-09-11 NOTE — ED Provider Notes (Signed)
[facility]    CSN: [id number] Arrival date & time: 09/11/22  1042      History   Chief Complaint Chief Complaint  Patient presents with   Sore Throat    Entered by patient    HPI Kimberly Archer is a 41 y.o. female.   HPI She is seen here for an upper respiratory infection a couple of weeks ago.  Treated with 10 days of antibiotics and prednisone.  On the 10th day of medication, she came down with a sudden onset of ear pain sore throat cough congestion fatigue.  She has been taking TheraFlu Tylenol and ibuprofen.  She states her blood pressure is up her heart is racing she feels fatigued. Patient is a Therapist, music.  COVID test at home is negative  Past Medical History:  Diagnosis Date   GERD (gastroesophageal reflux disease)     Patient Active Problem List   Diagnosis Date Noted   Sprain of medial collateral ligament of right knee 08/17/2021    Past Surgical History:  Procedure Laterality Date   APPENDECTOMY      OB History   No obstetric history on file.      Home Medications    Prior to Admission medications   Medication Sig Start Date End Date Taking? Authorizing Provider  oseltamivir (TAMIFLU) 75 MG capsule Take 1 capsule (75 mg total) by mouth every 12 (twelve) hours. 09/11/22  Yes Eustace Moore, MD  cetirizine (ZYRTEC) 10 MG tablet Take 10 mg by mouth daily.    [provider]  fexofenadine (ALLEGRA ALLERGY) 180 MG tablet Take 1 tablet (180 mg total) by mouth daily for 15 days. 08/30/22 09/14/22  Trevor Iha, FNP  hydrOXYzine (ATARAX) 10 MG tablet Take 10 mg by mouth daily. 08/19/22   [provider]  montelukast (SINGULAIR) 10 MG tablet Take 10 mg at bedtime by mouth.    [provider]  Albuterol Sulfate (PROAIR RESPICLICK) 108 (90 BASE) MCG/ACT AEPB Inhale 2 puffs into the lungs every 4 (four) hours as needed. 12/06/14 10/23/15  Sherren Mocha, MD    Family History Family History  Problem Relation  Age of Onset   Hypertension Mother    Hypertension Father     Social History Social History   Tobacco Use   Smoking status: Never   Smokeless tobacco: Never  Vaping Use   Vaping Use: Never used  Substance Use Topics   Alcohol use: Yes    Comment: socially   Drug use: No     Allergies   Patient has no known allergies.   Review of Systems Review of Systems See HPI  Physical Exam Triage Vital Signs ED Triage Vitals  Enc Vitals Group     BP 09/11/22 1134 (!) 154/104     Pulse Rate 09/11/22 1134 (!) 108     Resp 09/11/22 1134 20     Temp 09/11/22 1134 97.8 F (36.6 C)     Temp Source 09/11/22 1134 Oral     SpO2 09/11/22 1134 98 %     Weight 09/11/22 1138 215 lb (97.5 kg)     Height --      Head Circumference --      Peak Flow --      Pain Score 09/11/22 1136 7     Pain Loc --      Pain Edu? --      Excl. in GC? --    No data found.  Updated Vital  Signs BP (!) 154/104 (BP Location: Right Arm)   Pulse (!) 108   Temp 97.8 F (36.6 C) (Oral)   Resp 20   Wt 97.5 kg   SpO2 98%   BMI 35.78 kg/m      Physical Exam Constitutional:      General: She is not in acute distress.    Appearance: She is well-developed. She is obese. She is ill-appearing.  HENT:     Head: Normocephalic and atraumatic.     Right Ear: Tympanic membrane and ear canal normal.     Left Ear: Tympanic membrane and ear canal normal.     Nose: Congestion present.     Mouth/Throat:     Pharynx: Posterior oropharyngeal erythema present.  Eyes:     Conjunctiva/sclera: Conjunctivae normal.     Pupils: Pupils are equal, round, and reactive to light.  Cardiovascular:     Rate and Rhythm: Tachycardia present.     Heart sounds: Normal heart sounds.  Pulmonary:     Effort: Pulmonary effort is normal. No respiratory distress.     Breath sounds: Normal breath sounds.  Abdominal:     General: There is no distension.     Palpations: Abdomen is soft.  Musculoskeletal:        General: Normal  range of motion.     Cervical back: Normal range of motion.  Lymphadenopathy:     Cervical: No cervical adenopathy.  Skin:    General: Skin is warm and dry.  Neurological:     Mental Status: She is alert.  Psychiatric:        Mood and Affect: Mood normal.        Behavior: Behavior normal.      UC Treatments / Results  Labs (all labs ordered are listed, but only abnormal results are displayed) Labs Reviewed  POCT INFLUENZA A/B - Abnormal; Notable for the following components:      Result Value   Influenza B, POC Positive (*)    All other components within normal limits    EKG   Radiology No results found.  Procedures Procedures (including critical care time)  Medications Ordered in UC Medications - No data to display  Initial Impression / Assessment and Plan / UC Course  I have reviewed the triage vital signs and the nursing notes.  Pertinent labs & imaging results that were available during my care of the patient were reviewed by me and considered in my medical decision making (see chart for details).      The patient that elevated blood pressure and heart rate are common with viral upper respiratory illnesses as well as taking common cold and flu medicines with decongestants.  I think it will resolve with treatment of illness.  Fluids discussed. Final Clinical Impressions(s) / UC Diagnoses   Final diagnoses:  Influenza B     Discharge Instructions      Take Tamiflu 2 times a day for 5 days Rest and drink plenty of fluids May continue over-the-counter cough and cold medications May return to work when your symptoms have improved and you have no fever for 24 hours See your doctor if not improving by next week   ED Prescriptions     Medication Sig Dispense Auth. Provider   oseltamivir (TAMIFLU) 75 MG capsule Take 1 capsule (75 mg total) by mouth every 12 (twelve) hours. 10 capsule Eustace Moore, MD      PDMP not reviewed this encounter.    Rica Mast  Fannie Knee, MD 09/11/22 1246

## 2022-09-11 NOTE — Discharge Instructions (Signed)
Take Tamiflu 2 times a day for 5 days Rest and drink plenty of fluids May continue over-the-counter cough and cold medications May return to work when your symptoms have improved and you have no fever for 24 hours See your doctor if not improving by next week

## 2022-09-26 ENCOUNTER — Ambulatory Visit: Payer: Federal, State, Local not specified - PPO

## 2022-11-20 IMAGING — DX DG KNEE COMPLETE 4+V*R*
4 series · 4 of 4 positions shown · non-contrast
Comparison: None.

CLINICAL DATA: Right knee pain after injury on 07/21/2021

EXAM:
RIGHT KNEE - COMPLETE 4+ VIEW

[knee ap]
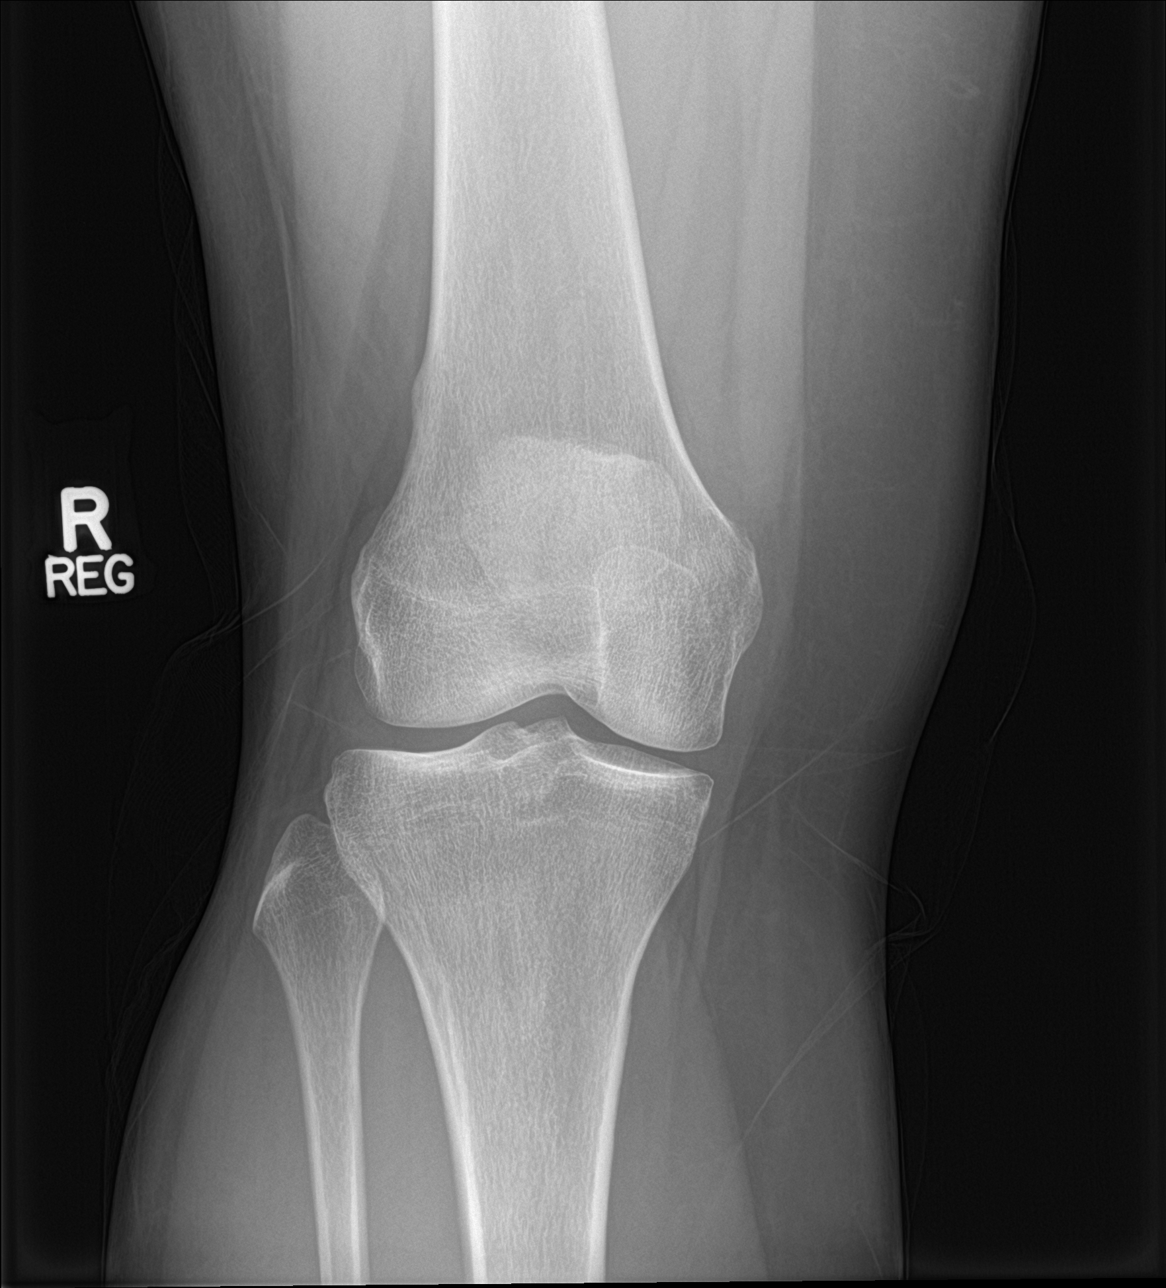

[knee lat]
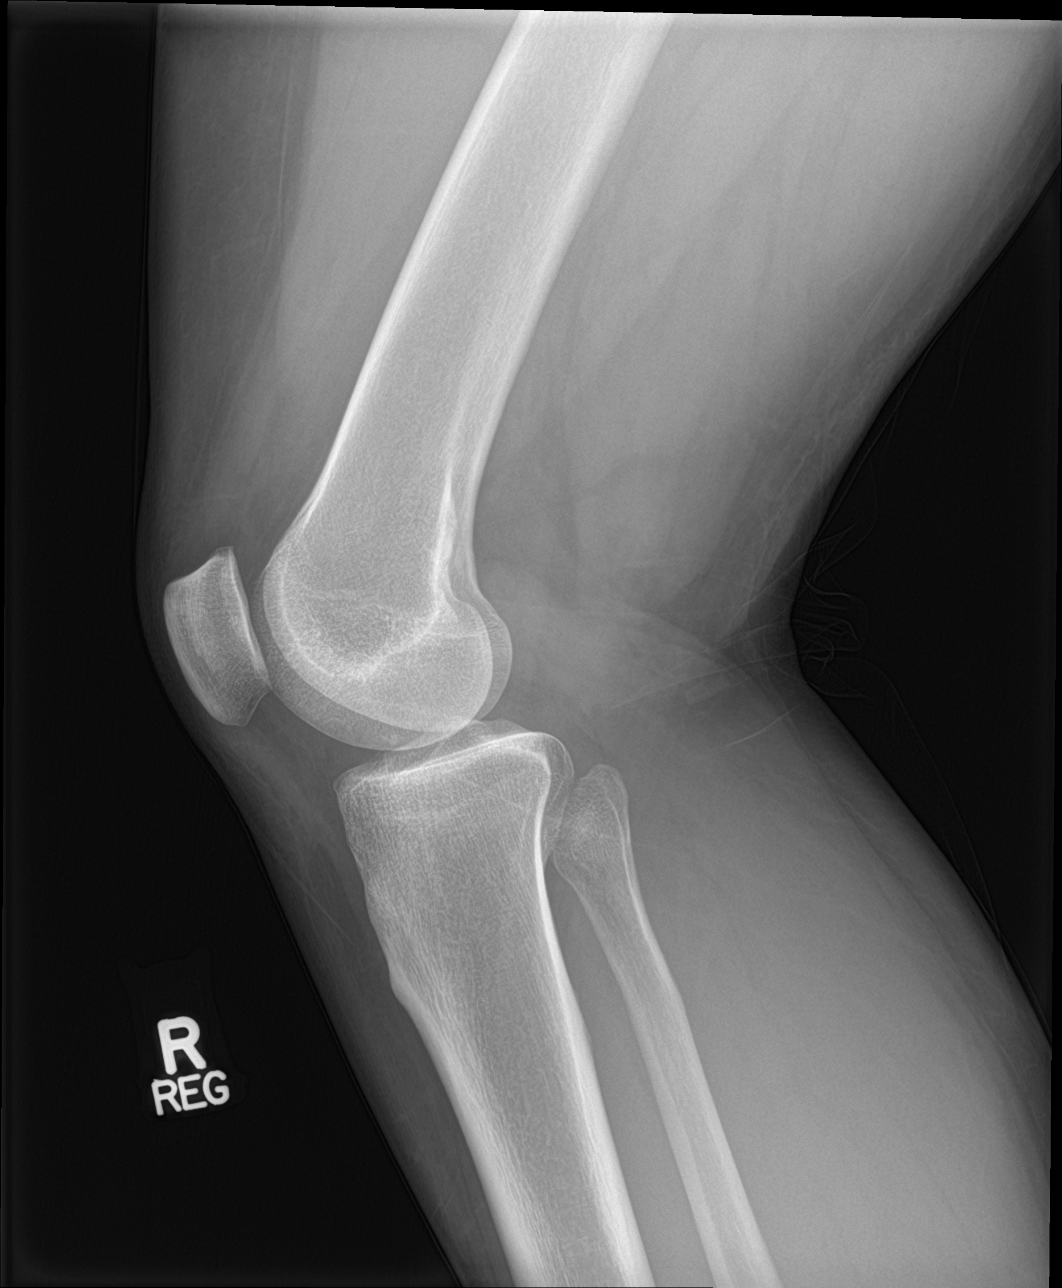

[knee obl]
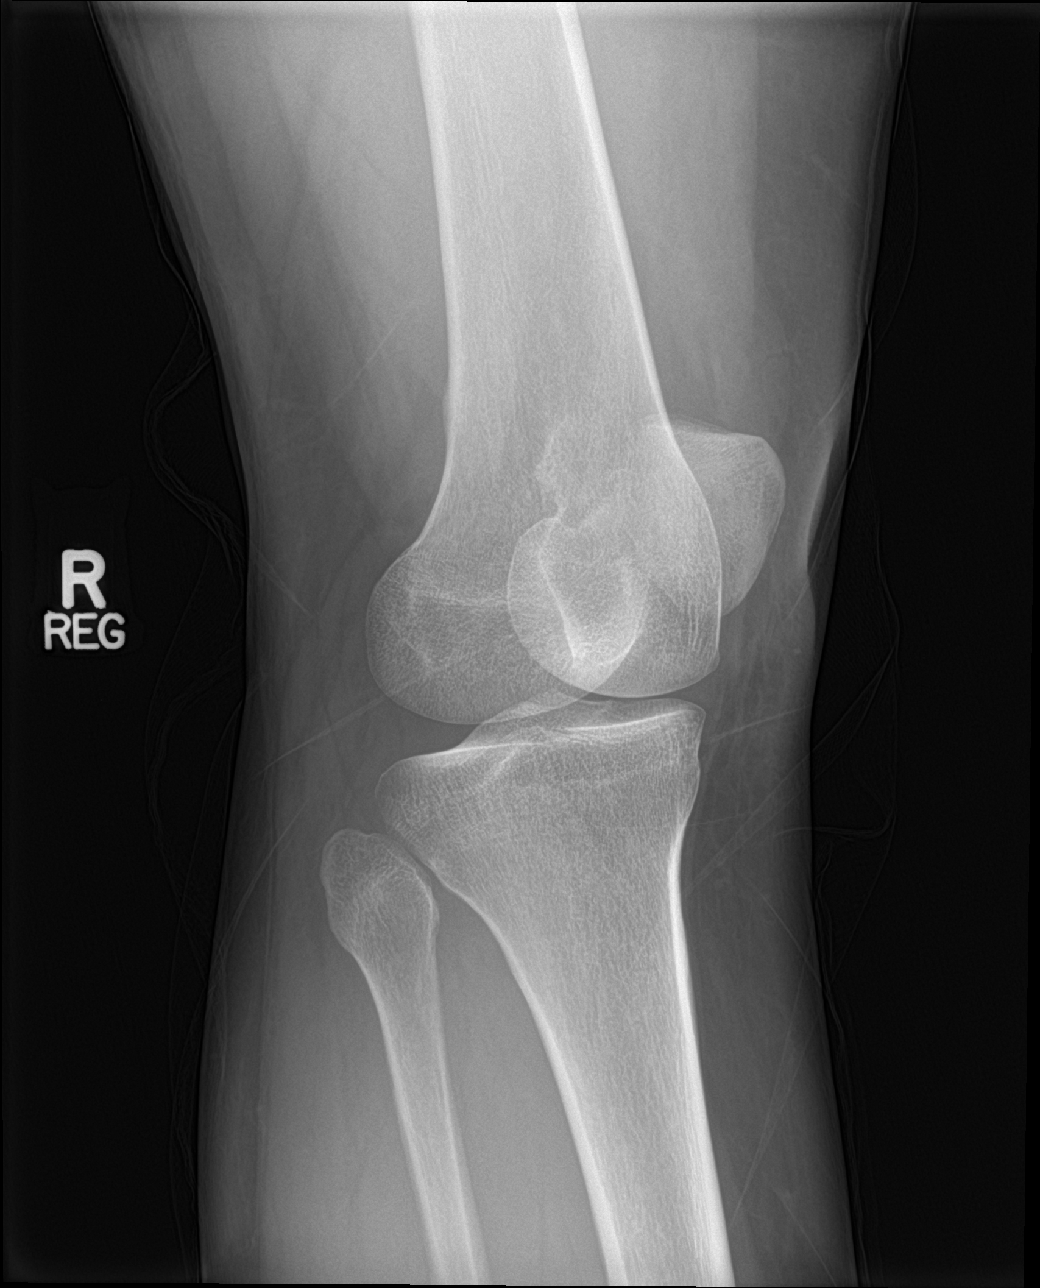

[knee sunrise standing]
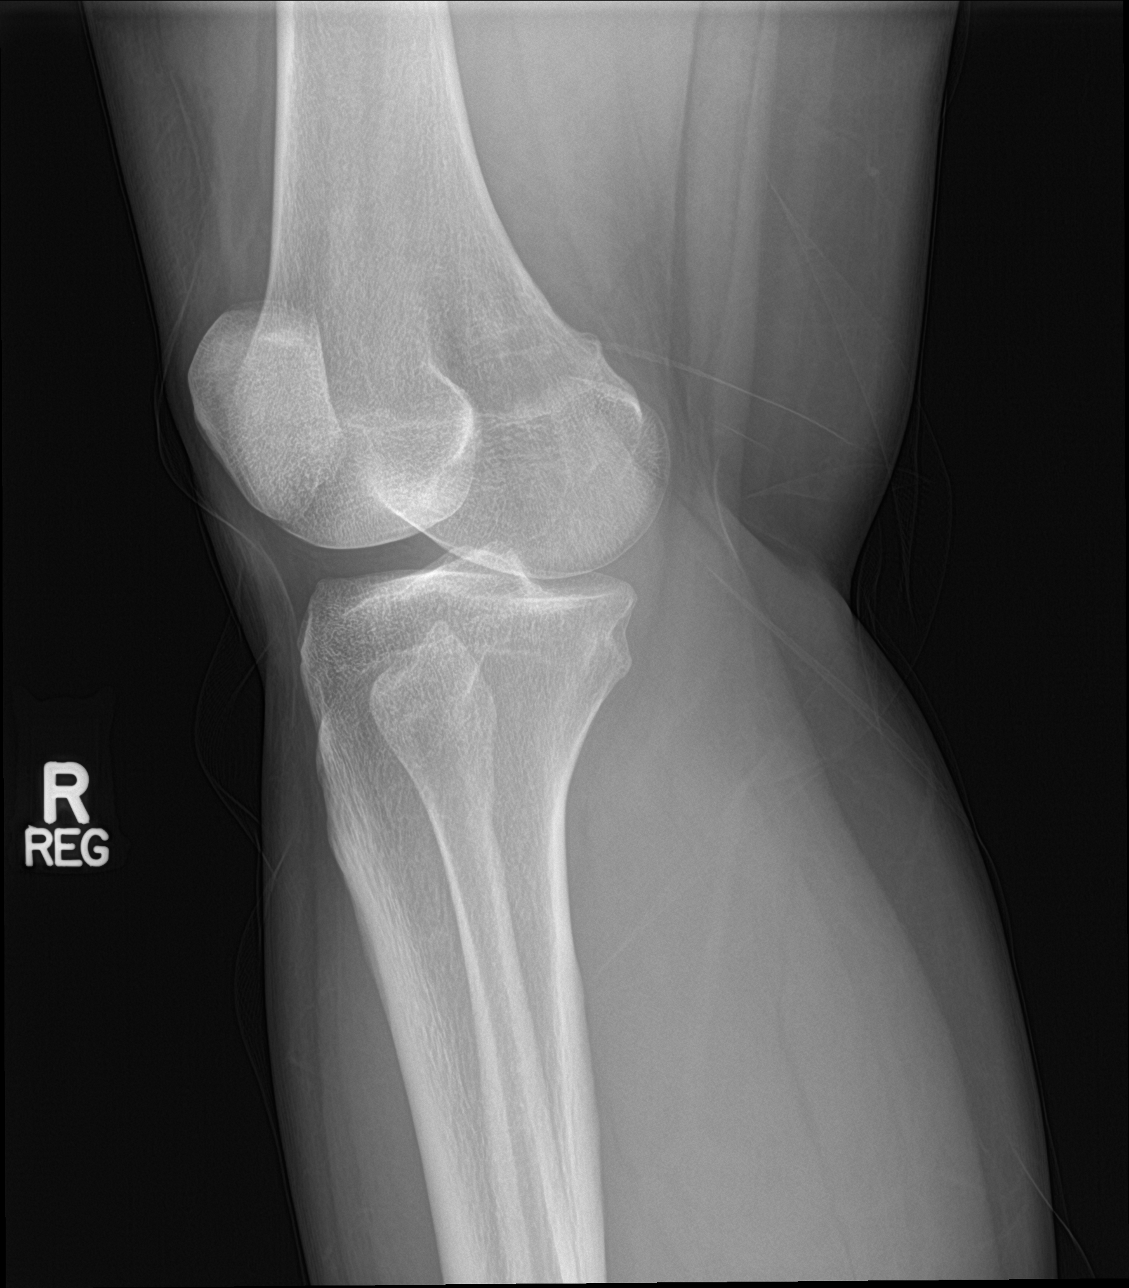

[4 of 4 positions shown; findings below may reference images not displayed]

FINDINGS: No evidence of fracture, dislocation, or joint effusion. No evidence
of arthropathy or other focal bone abnormality. Soft tissues are
unremarkable.
IMPRESSION: Negative.

## 2022-12-21 ENCOUNTER — Encounter: Payer: Self-pay | Admitting: *Deleted

## 2022-12-21 ENCOUNTER — Ambulatory Visit
Admission: EM | Admit: 2022-12-21 | Discharge: 2022-12-21 | Disposition: A | Discharge status: Home / Self Care | Payer: Federal, State, Local not specified - PPO | Attending: Emergency Medicine | Admitting: Emergency Medicine

## 2022-12-21 DIAGNOSIS — J351 Hypertrophy of tonsils: Secondary | ICD-10-CM | POA: Diagnosis present

## 2022-12-21 DIAGNOSIS — J029 Acute pharyngitis, unspecified: Secondary | ICD-10-CM | POA: Insufficient documentation

## 2022-12-21 LAB — POCT RAPID STREP A (OFFICE): Rapid Strep A Screen: NEGATIVE

## 2022-12-21 MED ORDER — LIDOCAINE VISCOUS HCL 2 % MT SOLN: 15.0000 mL | OROMUCOSAL | 0 refills | Status: AC | PRN

## 2022-12-21 MED ORDER — IBUPROFEN 800 MG PO TABS: 800.0000 mg | ORAL_TABLET | Freq: Three times a day (TID) | ORAL | 0 refills | Status: DC

## 2022-12-21 NOTE — ED Triage Notes (Signed)
Patient c/o 3 days of awakening with pain on the right side of her head which resolves then returns. Since yesterday she c/o right side of her throat and neck pain and visualizing tonsil stones. Also c/o nasal congestion and drainage. Taken dayquil.

## 2022-12-21 NOTE — Discharge Instructions (Addendum)
Ibuprofen every 6 hours Lidocaine gargle and spit for numbing relief  Call ENT to schedule an appointment for evaluation of tonsil  We will call you if anything grows on throat culture

## 2022-12-21 NOTE — ED Provider Notes (Signed)
[facility]    CSN: [id number] Arrival date & time: 12/21/22  1114      History   Chief Complaint Chief Complaint  Patient presents with   Sore Throat   Headache   Nasal Congestion    HPI Kimberly Archer is a 42 y.o. female.  Here with 3-day history of headache, sore throat 6/10, nasal congestion and drainage She saw some tonsil stones, swollen right tonsil No fevers She tried DayQuil and advil Works at the [employer] as an Therapist, sports  Past Medical History:  Diagnosis Date   GERD (gastroesophageal reflux disease)     Patient Active Problem List   Diagnosis Date Noted   Sprain of medial collateral ligament of right knee 08/17/2021    Past Surgical History:  Procedure Laterality Date   APPENDECTOMY      OB History   No obstetric history on file.      Home Medications    Prior to Admission medications   Medication Sig Start Date End Date Taking? Authorizing Provider  ibuprofen (ADVIL) 800 MG tablet Take 1 tablet (800 mg total) by mouth 3 (three) times daily. 12/21/22  Yes Candelario Steppe, PA-C  lidocaine (XYLOCAINE) 2 % solution Use as directed 15 mLs in the mouth or throat every 3 (three) hours as needed for mouth pain. 12/21/22  Yes Becci Batty, Wells Guiles, PA-C  montelukast (SINGULAIR) 10 MG tablet Take 10 mg at bedtime by mouth.   Yes [provider]  cetirizine (ZYRTEC) 10 MG tablet Take 10 mg by mouth daily.    [provider]  hydrOXYzine (ATARAX) 10 MG tablet Take 10 mg by mouth daily. 08/19/22   [provider]  Albuterol Sulfate (PROAIR RESPICLICK) 123XX123 (90 BASE) MCG/ACT AEPB Inhale 2 puffs into the lungs every 4 (four) hours as needed. 12/06/14 10/23/15  Shawnee Knapp, MD    Family History Family History  Problem Relation Age of Onset   Hypertension Mother    Hypertension Father     Social History Social History   Tobacco Use   Smoking status: Never   Smokeless tobacco: Never  Vaping Use   Vaping Use: Never used  Substance  Use Topics   Alcohol use: Yes    Comment: socially   Drug use: No     Allergies   Patient has no known allergies.   Review of Systems Review of Systems As per HPI  Physical Exam Triage Vital Signs ED Triage Vitals  Enc Vitals Group     BP 12/21/22 1121 127/85     Pulse Rate 12/21/22 1121 97     Resp 12/21/22 1121 18     Temp 12/21/22 1121 98.2 F (36.8 C)     Temp Source 12/21/22 1121 Oral     SpO2 12/21/22 1121 95 %     Weight --      Height --      Head Circumference --      Peak Flow --      Pain Score 12/21/22 1125 6     Pain Loc --      Pain Edu? --      Excl. in [place]? --    No data found.  Updated Vital Signs BP 127/85 (BP Location: Right Arm)   Pulse 97   Temp 98.2 F (36.8 C) (Oral)   Resp 18   SpO2 95%    Physical Exam Vitals and nursing note reviewed.  Constitutional:      General: She is  not in acute distress.    Comments: Normal phonation, tolerate secretions  HENT:     Right Ear: Tympanic membrane and ear canal normal.     Left Ear: Tympanic membrane and ear canal normal.     Nose: No congestion.     Mouth/Throat:     Mouth: Mucous membranes are moist.     Pharynx: Oropharynx is clear. Posterior oropharyngeal erythema present.     Tonsils: No tonsillar exudate or tonsillar abscesses. 3+ on the right. 1+ on the left.     Comments: Mild erythema  Eyes:     Conjunctiva/sclera: Conjunctivae normal.  Cardiovascular:     Rate and Rhythm: Normal rate and regular rhythm.     Heart sounds: Normal heart sounds.  Pulmonary:     Effort: Pulmonary effort is normal.     Breath sounds: Normal breath sounds.  Musculoskeletal:     Cervical back: Normal range of motion. No rigidity.  Lymphadenopathy:     Cervical: No cervical adenopathy.  Skin:    General: Skin is warm and dry.  Neurological:     Mental Status: She is alert and oriented to person, place, and time.     UC Treatments / Results  Labs (all labs ordered are listed, but only  abnormal results are displayed) Labs Reviewed  CULTURE, GROUP A STREP [facility])  POCT RAPID STREP A (OFFICE)    EKG   Radiology No results found.  Procedures Procedures (including critical care time)  Medications Ordered in UC Medications - No data to display  Initial Impression / Assessment and Plan / UC Course  I have reviewed the triage vital signs and the nursing notes.  Pertinent labs & imaging results that were available during my care of the patient were reviewed by me and considered in my medical decision making (see chart for details).  Afebrile in clinic Strep test negative, culture pending With unilateral tonsillar hypertrophy, recommend close follow-up with ENT In the meantime pain control with ibuprofen, lidocaine gargle Return precautions discussed. Patient agrees to plan  Final Clinical Impressions(s) / UC Diagnoses   Final diagnoses:  Tonsillar hypertrophy, unilateral  Sore throat     Discharge Instructions      Ibuprofen every 6 hours Lidocaine gargle and spit for numbing relief  Call ENT to schedule an appointment for evaluation of tonsil  We will call you if anything grows on throat culture     ED Prescriptions     Medication Sig Dispense Auth. Provider   ibuprofen (ADVIL) 800 MG tablet Take 1 tablet (800 mg total) by mouth 3 (three) times daily. 21 tablet Royale Lennartz, PA-C   lidocaine (XYLOCAINE) 2 % solution Use as directed 15 mLs in the mouth or throat every 3 (three) hours as needed for mouth pain. 100 mL Yannick Steuber, Wells Guiles, PA-C      PDMP not reviewed this encounter.   Les Pou, [place] 12/21/22 1232

## 2022-12-24 LAB — CULTURE, GROUP A STREP (THRC)

## 2023-01-31 ENCOUNTER — Encounter: Payer: Self-pay | Admitting: *Deleted

## 2023-03-02 ENCOUNTER — Ambulatory Visit: Payer: Federal, State, Local not specified - PPO

## 2023-03-30 ENCOUNTER — Ambulatory Visit
Admission: RE | Admit: 2023-03-30 | Discharge: 2023-03-30 | Disposition: A | Discharge status: Home / Self Care | Payer: Federal, State, Local not specified - PPO | Source: Ambulatory Visit | Attending: Family Medicine | Admitting: Family Medicine

## 2023-03-30 ENCOUNTER — Ambulatory Visit (INDEPENDENT_AMBULATORY_CARE_PROVIDER_SITE_OTHER): Payer: Federal, State, Local not specified - PPO

## 2023-03-30 VITALS — BP 132/93 | HR 103 | Temp 98.7°F | Resp 16

## 2023-03-30 DIAGNOSIS — M62830 Muscle spasm of back: Secondary | ICD-10-CM

## 2023-03-30 DIAGNOSIS — M25511 Pain in right shoulder: Secondary | ICD-10-CM

## 2023-03-30 DIAGNOSIS — M5412 Radiculopathy, cervical region: Secondary | ICD-10-CM

## 2023-03-30 DIAGNOSIS — S161XXA Strain of muscle, fascia and tendon at neck level, initial encounter: Secondary | ICD-10-CM

## 2023-03-30 MED ORDER — PREDNISONE 10 MG (21) PO TBPK: ORAL_TABLET | Freq: Every day | ORAL | 0 refills | Status: DC

## 2023-03-30 MED ORDER — CELECOXIB 200 MG PO CAPS: 200.0000 mg | ORAL_CAPSULE | Freq: Every day | ORAL | 0 refills | Status: AC

## 2023-03-30 MED ORDER — METHYLPREDNISOLONE SODIUM SUCC 125 MG IJ SOLR
125.0000 mg | Freq: Once | INTRAMUSCULAR | Status: AC
  Administered 2023-03-30: 125 mg via INTRAMUSCULAR

## 2023-03-30 MED ORDER — METHOCARBAMOL 500 MG PO TABS: 500.0000 mg | ORAL_TABLET | Freq: Three times a day (TID) | ORAL | 0 refills | Status: DC | PRN

## 2023-03-30 NOTE — Discharge Instructions (Addendum)
Advised patient to take medications as directed with food to completion.  Advised patient may take Robaxin daily or as needed for accompanying trapezius muscle spasms of cervical region.  Encouraged increase daily water intake to 64 ounces per day while taking these medications.  Advised if symptoms worsen and/or unresolved please follow-up with your PCP or [facility] providers.  Contact information included with this AVS today.

## 2023-03-30 NOTE — ED Provider Notes (Signed)
[facility]    CSN: [id number] Arrival date & time: 03/30/23  1241      History   Chief Complaint Chief Complaint  Patient presents with   Shoulder Pain    It has been a week and a half now. Thought I just slept wrong. I'm having tearing pain in my right shoulder, right deltoid and right elbow. I've tried heat biofreeze pads, diclofenac pills, diclofenac, gel, tramadol, meloxicam can't sleep - Entered by patient    HPI Kimberly Archer is a 42 y.o. female.   HPI 42 year old female presents with right shoulder pain for 1.5 to 2 weeks.  Patient reports pain is keeping her awake at night.  Patient reports trying heating pad, Biofreeze, diclofenac tablets and gel, meloxicam, tramadol, ibuprofen and massager.  PMH significant for morbid obesity, GERD, and ASCUS of cervix.  Past Medical History:  Diagnosis Date   GERD (gastroesophageal reflux disease)     Patient Active Problem List   Diagnosis Date Noted   Sprain of medial collateral ligament of right knee 08/17/2021    Past Surgical History:  Procedure Laterality Date   APPENDECTOMY      OB History   No obstetric history on file.      Home Medications    Prior to Admission medications   Medication Sig Start Date End Date Taking? Authorizing Provider  celecoxib (CELEBREX) 200 MG capsule Take 1 capsule (200 mg total) by mouth daily for 15 days. 03/30/23 04/14/23 Yes Trevor Iha, FNP  hydrOXYzine (ATARAX) 10 MG tablet Take 10 mg by mouth daily. 08/19/22  Yes [provider]  methocarbamol (ROBAXIN) 500 MG tablet Take 1 tablet (500 mg total) by mouth 3 (three) times daily as needed. 03/30/23  Yes Trevor Iha, FNP  predniSONE (STERAPRED UNI-PAK 21 TAB) 10 MG (21) TBPK tablet Take by mouth daily. Take 6 tabs by mouth daily  for 2 days, then 5 tabs for 2 days, then 4 tabs for 2 days, then 3 tabs for 2 days, 2 tabs for 2 days, then 1 tab by mouth daily for 2 days 03/30/23  Yes Trevor Iha, FNP   cetirizine (ZYRTEC) 10 MG tablet Take 10 mg by mouth daily.    [provider]  lidocaine (XYLOCAINE) 2 % solution Use as directed 15 mLs in the mouth or throat every 3 (three) hours as needed for mouth pain. 12/21/22   Rising, Rebecca, PA-C  montelukast (SINGULAIR) 10 MG tablet Take 10 mg at bedtime by mouth.    [provider]  Albuterol Sulfate (PROAIR RESPICLICK) 108 (90 BASE) MCG/ACT AEPB Inhale 2 puffs into the lungs every 4 (four) hours as needed. 12/06/14 10/23/15  Sherren Mocha, MD    Family History Family History  Problem Relation Age of Onset   Hypertension Mother    Hypertension Father     Social History Social History   Tobacco Use   Smoking status: Never   Smokeless tobacco: Never  Vaping Use   Vaping Use: Never used  Substance Use Topics   Alcohol use: Yes    Comment: socially   Drug use: No     Allergies   Patient has no known allergies.   Review of Systems Review of Systems  Musculoskeletal:  Positive for neck pain.       Right shoulder pain with decreased range of motion for 1.5 weeks     Physical Exam Triage Vital Signs ED Triage Vitals [03/30/23 1254]  Enc Vitals Group  BP (!) 132/93     Pulse Rate (!) 103     Resp 16     Temp 98.7 F (37.1 C)     Temp Source Oral     SpO2 98 %     Weight      Height      Head Circumference      Peak Flow      Pain Score      Pain Loc      Pain Edu?      Excl. in GC?    No data found.  Updated Vital Signs BP (!) 132/93 (BP Location: Left Arm)   Pulse (!) 103   Temp 98.7 F (37.1 C) (Oral)   Resp 16   LMP 03/15/2023   SpO2 98%     Physical Exam Vitals and nursing note reviewed.  Constitutional:      Appearance: She is obese.  HENT:     Head: Normocephalic and atraumatic.     Mouth/Throat:     Mouth: Mucous membranes are moist.     Pharynx: Oropharynx is clear.  Eyes:     Extraocular Movements: Extraocular movements intact.     Conjunctiva/sclera: Conjunctivae normal.      Pupils: Pupils are equal, round, and reactive to light.  Cardiovascular:     Rate and Rhythm: Normal rate and regular rhythm.     Pulses: Normal pulses.     Heart sounds: Normal heart sounds.  Pulmonary:     Effort: Pulmonary effort is normal.     Breath sounds: Normal breath sounds. No wheezing, rhonchi or rales.  Musculoskeletal:        General: Normal range of motion.     Cervical back: Normal range of motion and neck supple.     Comments: Right shoulder (anterior/medial aspect) TTP over GH, limited range of motion forward flexion/extension/horizontal abduction/adduction/external rotation; patient reporting tingling pain from right shoulder to fingertips; moderate palpable right-sided trapezius spasms noted  Skin:    General: Skin is warm and dry.  Neurological:     General: No focal deficit present.     Mental Status: She is alert and oriented to person, place, and time. Mental status is at baseline.      UC Treatments / Results  Labs (all labs ordered are listed, but only abnormal results are displayed) Labs Reviewed - No data to display  EKG   Radiology DG Cervical Spine Complete  Result Date: 03/30/2023 CLINICAL DATA:  Right shoulder pain for 2 weeks.  Neck pain. EXAM: CERVICAL SPINE - COMPLETE 4+ VIEW COMPARISON:  None Available. FINDINGS: Cervical spine is visualized from the occiput to the cervicothoracic junction. Straightening of the normal cervical lordosis. Alignment is otherwise anatomic. Vertebral body and disc space heights are maintained. Prevertebral soft tissues are within normal limits. Very minimal anterior marginal osteophytosis at C5-6. Neural foramina are patent. Visualized lung apices are clear. IMPRESSION: Straightening of the normal cervical lordosis with minimal degenerative disc disease at C5-6. Electronically Signed   By: Leanna Battles M.D.   On: 03/30/2023 14:28   DG Shoulder Right  Result Date: 03/30/2023 CLINICAL DATA:  Right shoulder pain  EXAM: RIGHT SHOULDER - 3 VIEW COMPARISON:  None Available. FINDINGS: There is no evidence of fracture or dislocation. No evidence of arthropathy or other focal bone abnormality. Old right sixth seventh rib fractures. Soft tissues are unremarkable. IMPRESSION: No acute osseous abnormality. Electronically Signed   By: Allegra Lai M.D.   On: 03/30/2023  14:28    Procedures Procedures (including critical care time)  Medications Ordered in UC Medications  methylPREDNISolone sodium succinate (SOLU-MEDROL) 125 mg/2 mL injection 125 mg (125 mg Intramuscular Given 03/30/23 1428)    Initial Impression / Assessment and Plan / UC Course  I have reviewed the triage vital signs and the nursing notes.  Pertinent labs & imaging results that were available during my care of the patient were reviewed by me and considered in my medical decision making (see chart for details).     MDM: 1.  Right shoulder pain, unspecified chronicity-right shoulder x-ray revealed above, Rx'd Celebrex 200 mg daily x 15 days; 2.  Cervical radiculopathy-cervical spine x-ray results revealed above, Rx'd Sterapred Unipak (tapering from 60 mg to 10 mg over 10 days); 3.  Spasm of right trapezius muscle-Rx'd Robaxin 500 mg 3 times daily, as needed; 4.  Strain of neck muscle, initial encounter-Rx Sterapred Unipak (tapering from 60 mg to 10 mg over 10 days). Advised patient to take medications as directed with food to completion.  Advised patient may take Robaxin daily or as needed for accompanying trapezius muscle spasms of cervical region.  Encouraged increase daily water intake to 64 ounces per day while taking these medications.  Advised if symptoms worsen and/or unresolved please follow-up with your PCP or [facility] providers.  Contact information included with this AVS today.  Work note provided to patient per request prior to discharge today.  Patient discharged home, hemodynamically stable.   Final Clinical  Impressions(s) / UC Diagnoses   Final diagnoses:  Right shoulder pain, unspecified chronicity  Cervical radiculopathy  Strain of neck muscle, initial encounter  Spasm of right trapezius muscle     Discharge Instructions      Advised patient to take medications as directed with food to completion.  Advised patient may take Robaxin daily or as needed for accompanying trapezius muscle spasms of cervical region.  Encouraged increase daily water intake to 64 ounces per day while taking these medications.  Advised if symptoms worsen and/or unresolved please follow-up with your PCP or [facility] providers.  Contact information included with this AVS today.     ED Prescriptions     Medication Sig Dispense Auth. Provider   predniSONE (STERAPRED UNI-PAK 21 TAB) 10 MG (21) TBPK tablet Take by mouth daily. Take 6 tabs by mouth daily  for 2 days, then 5 tabs for 2 days, then 4 tabs for 2 days, then 3 tabs for 2 days, 2 tabs for 2 days, then 1 tab by mouth daily for 2 days 42 tablet Trevor Iha, FNP   celecoxib (CELEBREX) 200 MG capsule Take 1 capsule (200 mg total) by mouth daily for 15 days. 15 capsule Trevor Iha, FNP   methocarbamol (ROBAXIN) 500 MG tablet Take 1 tablet (500 mg total) by mouth 3 (three) times daily as needed. 30 tablet Trevor Iha, FNP      PDMP not reviewed this encounter.   Trevor Iha, FNP 03/30/23 1443

## 2023-03-30 NOTE — ED Triage Notes (Signed)
Right shoulder pain starting 1.5 weeks ago. Began like a crick in her neck and has spread into shoulder, elbow, and neck. Pain is keeping her awake at night. Has used heating pad, biofreeze, diclofenac pills and gel, meloxicam, tramadol, ibuprofen, and massager. States around 2 weeks prior she was doing yard work and may have contributed to this. Described as a tearing sensation that is constantly there, with numbness/tingling extending into fingers of effected hand.

## 2023-04-05 ENCOUNTER — Ambulatory Visit: Payer: Federal, State, Local not specified - PPO | Admitting: Family Medicine

## 2023-09-20 ENCOUNTER — Ambulatory Visit: Payer: Federal, State, Local not specified - PPO

## 2023-09-21 ENCOUNTER — Ambulatory Visit: Payer: Federal, State, Local not specified - PPO

## 2023-09-21 ENCOUNTER — Other Ambulatory Visit: Payer: Self-pay

## 2023-09-21 ENCOUNTER — Ambulatory Visit
Admission: RE | Admit: 2023-09-21 | Discharge: 2023-09-21 | Disposition: A | Discharge status: Home / Self Care | Payer: Federal, State, Local not specified - PPO | Source: Ambulatory Visit | Attending: Family Medicine | Admitting: Family Medicine

## 2023-09-21 VITALS — BP 143/97 | HR 81 | Temp 97.9°F | Resp 16

## 2023-09-21 DIAGNOSIS — S93602A Unspecified sprain of left foot, initial encounter: Secondary | ICD-10-CM

## 2023-09-21 DIAGNOSIS — S86312A Strain of muscle(s) and tendon(s) of peroneal muscle group at lower leg level, left leg, initial encounter: Secondary | ICD-10-CM

## 2023-09-21 DIAGNOSIS — M25572 Pain in left ankle and joints of left foot: Secondary | ICD-10-CM

## 2023-09-21 DIAGNOSIS — M79672 Pain in left foot: Secondary | ICD-10-CM

## 2023-09-21 NOTE — ED Provider Notes (Signed)
[facility]    CSN: [id number] Arrival date & time: 09/21/23  1538      History   Chief Complaint Chief Complaint  Patient presents with   Foot Injury    I fell on 11/22 rolled my ankle and I have bruising to the left ankle area with swelling. I have been using compression, ice and ibuprofen.The pain has radiated down to the bottom of my foot. It just doesn't feel right. I feel like I need an x-ray - Entered by patient    HPI Kimberly Archer is a 42 y.o. female.   Patient tripped two weeks ago, falling and inverting her left ankle.  Bruising and most swelling has resolved but she now has pain in the arch of her foot.  She has been using compression, ice, and ibuprofen.  She notes that she has a stirrup splint.  The history is provided by the patient.  Foot Injury Location:  Ankle and foot Time since incident:  2 weeks Injury: yes   Mechanism of injury: fall   Fall:    Fall occurred:  Walking Ankle location:  L ankle Foot location:  L foot Pain details:    Quality:  Aching   Radiates to:  Does not radiate   Severity:  Mild   Onset quality:  Sudden   Duration:  2 weeks   Timing:  Constant   Progression:  Improving Chronicity:  New Prior injury to area:  No Relieved by:  Rest Worsened by:  Activity and bearing weight Associated symptoms: swelling   Associated symptoms: no decreased ROM, no muscle weakness, no numbness and no tingling     Past Medical History:  Diagnosis Date   GERD (gastroesophageal reflux disease)     Patient Active Problem List   Diagnosis Date Noted   Sprain of medial collateral ligament of right knee 08/17/2021    Past Surgical History:  Procedure Laterality Date   APPENDECTOMY      OB History   No obstetric history on file.      Home Medications    Prior to Admission medications   Medication Sig Start Date End Date Taking? Authorizing Provider  cetirizine (ZYRTEC) 10 MG tablet Take 10 mg by mouth daily.     [provider]  hydrOXYzine (ATARAX) 10 MG tablet Take 10 mg by mouth daily. 08/19/22   [provider]  lidocaine (XYLOCAINE) 2 % solution Use as directed 15 mLs in the mouth or throat every 3 (three) hours as needed for mouth pain. 12/21/22   Rising, Lurena Joiner, PA-C  methocarbamol (ROBAXIN) 500 MG tablet Take 1 tablet (500 mg total) by mouth 3 (three) times daily as needed. 03/30/23   Trevor Iha, FNP  montelukast (SINGULAIR) 10 MG tablet Take 10 mg at bedtime by mouth.    [provider]  predniSONE (STERAPRED UNI-PAK 21 TAB) 10 MG (21) TBPK tablet Take by mouth daily. Take 6 tabs by mouth daily  for 2 days, then 5 tabs for 2 days, then 4 tabs for 2 days, then 3 tabs for 2 days, 2 tabs for 2 days, then 1 tab by mouth daily for 2 days 03/30/23   Trevor Iha, FNP  Albuterol Sulfate (PROAIR RESPICLICK) 108 (90 BASE) MCG/ACT AEPB Inhale 2 puffs into the lungs every 4 (four) hours as needed. 12/06/14 10/23/15  Sherren Mocha, MD    Family History Family History  Problem Relation Age of Onset   Hypertension Mother    Hypertension  Father     Social History Social History   Tobacco Use   Smoking status: Never   Smokeless tobacco: Never  Vaping Use   Vaping status: Never Used  Substance Use Topics   Alcohol use: Yes    Comment: socially   Drug use: No     Allergies   Patient has no known allergies.   Review of Systems Review of Systems  Musculoskeletal:  Positive for joint swelling.  Skin:  Positive for color change. Negative for wound.  All other systems reviewed and are negative.    Physical Exam Triage Vital Signs ED Triage Vitals  Encounter Vitals Group     BP 09/21/23 1544 (!) 143/97     Systolic BP Percentile --      Diastolic BP Percentile --      Pulse Rate 09/21/23 1544 81     Resp 09/21/23 1544 16     Temp 09/21/23 1544 97.9 F (36.6 C)     Temp src --      SpO2 09/21/23 1544 99 %     Weight --      Height --      Head Circumference  --      Peak Flow --      Pain Score 09/21/23 1546 2     Pain Loc --      Pain Education --      Exclude from Growth Chart --    No data found.  Updated Vital Signs BP (!) 143/97   Pulse 81   Temp 97.9 F (36.6 C)   Resp 16   SpO2 99%   Visual Acuity Right Eye Distance:   Left Eye Distance:   Bilateral Distance:    Right Eye Near:   Left Eye Near:    Bilateral Near:     Physical Exam Vitals and nursing note reviewed.  Constitutional:      General: She is not in acute distress. HENT:     Head: Atraumatic.  Eyes:     Conjunctiva/sclera: Conjunctivae normal.     Pupils: Pupils are equal, round, and reactive to light.  Cardiovascular:     Rate and Rhythm: Normal rate.  Pulmonary:     Effort: Pulmonary effort is normal.  Musculoskeletal:     Cervical back: Normal range of motion.     Left ankle: No swelling, deformity or ecchymosis. Tenderness present. Normal range of motion.     Left foot: Tenderness present. No swelling, deformity or crepitus.       Feet:     Comments: There is mild tenderness to palpation over the left peroneal tendon, mild tenderness to palpation on plantar surface of left foot as noted on diagram.    Skin:    General: Skin is warm and dry.  Neurological:     General: No focal deficit present.     Mental Status: She is alert.      UC Treatments / Results  Labs (all labs ordered are listed, but only abnormal results are displayed) Labs Reviewed - No data to display  EKG   Radiology No results found.  Procedures Procedures (including critical care time)  Medications Ordered in UC Medications - No data to display  Initial Impression / Assessment and Plan / UC Course  I have reviewed the triage vital signs and the nursing notes.  Pertinent labs & imaging results that were available during my care of the patient were reviewed by me and considered in my  medical decision making (see chart for details).    Ace wrap applied. Given  sprain treatment instructions with range of motion and stretching exercises.   Followup with sports medicine if not improved about two weeks.  Final Clinical Impressions(s) / UC Diagnoses   Final diagnoses:  Strain of peroneal tendon of left foot, initial encounter  Foot sprain, left, initial encounter     Discharge Instructions      Obtain well-fitting shoe arch support insert.  Wear ace wrap until swelling resolves.  Wear your Air-cast stirrup splint for one to two weeks. Begin ankle and foot stretching exercises.    ED Prescriptions   None       Lattie Haw, MD 09/21/23 2001

## 2023-09-21 NOTE — ED Triage Notes (Signed)
Fell on 11/22 and rolled left ankle, has bruising and swelling per patient. Has been using compression, ice, and ibuprofen. Pain radiates to bottom of foot.

## 2023-09-21 NOTE — Discharge Instructions (Signed)
Obtain well-fitting shoe arch support insert.  Wear ace wrap until swelling resolves.  Wear your Air-cast stirrup splint for one to two weeks. Begin ankle and foot stretching exercises.

## 2023-11-18 ENCOUNTER — Ambulatory Visit
Admission: RE | Admit: 2023-11-18 | Discharge: 2023-11-18 | Disposition: A | Discharge status: Home / Self Care | Payer: Federal, State, Local not specified - PPO | Source: Ambulatory Visit | Attending: Emergency Medicine | Admitting: Emergency Medicine

## 2023-11-18 VITALS — BP 145/93 | HR 87 | Temp 98.3°F | Resp 15

## 2023-11-18 DIAGNOSIS — J111 Influenza due to unidentified influenza virus with other respiratory manifestations: Secondary | ICD-10-CM

## 2023-11-18 DIAGNOSIS — Z20828 Contact with and (suspected) exposure to other viral communicable diseases: Secondary | ICD-10-CM | POA: Diagnosis not present

## 2023-11-18 MED ORDER — OSELTAMIVIR PHOSPHATE 75 MG PO CAPS: 75.0000 mg | ORAL_CAPSULE | Freq: Two times a day (BID) | ORAL | 0 refills | Status: AC

## 2023-11-18 MED ORDER — PROMETHAZINE-DM 6.25-15 MG/5ML PO SYRP: 5.0000 mL | ORAL_SOLUTION | Freq: Every evening | ORAL | 0 refills | Status: DC | PRN

## 2023-11-18 MED ORDER — GUAIFENESIN 400 MG PO TABS: ORAL_TABLET | ORAL | 0 refills | Status: DC

## 2023-11-18 MED ORDER — ALBUTEROL SULFATE HFA 108 (90 BASE) MCG/ACT IN AERS: 2.0000 | INHALATION_SPRAY | Freq: Four times a day (QID) | RESPIRATORY_TRACT | 2 refills | Status: AC | PRN

## 2023-11-18 MED ORDER — IPRATROPIUM BROMIDE 0.06 % NA SOLN: 2.0000 | Freq: Three times a day (TID) | NASAL | 1 refills | Status: AC

## 2023-11-18 MED ORDER — CETIRIZINE HCL 10 MG PO TABS: 10.0000 mg | ORAL_TABLET | Freq: Every day | ORAL | 1 refills | Status: AC

## 2023-11-18 NOTE — Discharge Instructions (Signed)
I apologize, again, that we do not have an adequate supply of influenza test here at this location.  Urgent care facilities have been hit hard with flu testing disease and and supplies have run out very quickly.  Because you been exposed to influenza, both through your work and family, I recommend that you begin Tamiflu empirically for presumed influenza.  Tamiflu stops the influenza virus from continuing to reproduce itself in your body thereby shutting down its ability to make you any sicker than you already are.  Ultimately, you feel better sooner.  Please read below to learn more about the other medications, dosages and frequencies that I recommend to help alleviate your symptoms and to get you feeling better soon:   Zyrtec (cetirizine): This is an excellent second-generation antihistamine that helps to reduce respiratory inflammatory response to environmental allergens.  In some patients, this medication can cause daytime sleepiness so I recommend that you take 1 tablet daily at bedtime.     Atrovent (ipratropium): This is an excellent nasal decongestant spray that does not cause rebound congestion.  Please instill 2 sprays into each nare with each use, you may use this up to 3 times daily.  Once you find that you are forgetting to use the spray more often that you remember to use it, you will know that you no longer need it.   ProAir, Ventolin, Proventil (albuterol): This inhaled medication contains a short acting beta agonist bronchodilator.  This medication relaxes the smooth muscle of the airway in the lungs.  When these muscles are tight, breathing becomes more constricted.  The result of relaxation of the smooth muscle is increased air movement and improved work of breathing.  This is a short acting medication that can be used every 4-6 hours as needed for increased work of breathing, shortness of breath, wheezing and excessive coughing.  It comes in the form of a handheld inhaler or nebulizer  solution.  I recommended that for the next 3 to 4 days, this medication is used 4 times daily on a scheduled basis then decrease to twice daily and as needed until symptoms have completely resolved which I anticipate will be several weeks.     Robitussin, Mucinex (guaifenesin): This is a daytime expectorant.  This single symptom reliever helps break up chest congestion and loosen up thick nasal drainage making phlegm and drainage easier to cough up and to blow out from your nose.  I recommend taking 400 mg in either liquid or tablet form three times daily as needed.  I do not recommend the 12-hour extended relief version or doses higher than 400 mg per each dose as these often make some patients feel jittery or jumpy and can interfere with sleep.  I also do not recommend that you purchase guaifenesin with the ingredient " DM" which is dextromethorphan, a cough suppressant which I only recommend taking at bedtime.  Guaifenesin 400 mg is a safe dose for people who are being treated for high blood pressure.     Promethazine DM: Promethazine is both a nasal decongestant that dries up mucous membranes and an antinausea medication.  Promethazine often makes most patients feel fairly sleepy.  "DM" is dextromethorphan, a single symptom reliever which is a cough suppressant found in many over-the-counter cough medications and combination cold preparations.  Please take 5 mL before bedtime to minimize your cough which will help you sleep better.  I have sent a prescription for this medication to your pharmacy because it cannot be  purchased over-the-counter.   If symptoms have not meaningfully improved in the next 5 to 7 days, please return for repeat evaluation or follow-up with your regular provider.  If symptoms have worsened in the next 3 to 5 days, please go to the emergency room for further evaluation.    Thank you for visiting urgent care today.  I sincerely hope that your grandmother has a speedy recovery.   Please know that we appreciate the opportunity to participate in your care.

## 2023-11-18 NOTE — ED Provider Notes (Signed)
[facility]    CSN: [id number] Arrival date & time: 11/18/23  0801    HISTORY   Chief Complaint  Patient presents with   Cough    Entered by patient   HPI [facility] is a pleasant, 43 y.o. female who presents to urgent care today. Patient complains of a 2-day history of coughing and wheezing.  Patient states she has been taking Oscilococcinum, over-the-counter cold and flu preparation and an over-the-counter cough medication.  Patient reports influenza exposure 1 week ago and again 3 days ago.  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  Patient states she has an audio recording of herself wheezing, which she placed for me during visit.  Patient states the cough has been nonproductive.  Also endorses nasal congestion, rhinorrhea, body aches and fatigue.  Patient denies fever, headache, sore throat, nausea, vomiting, diarrhea.  The history is provided by the patient.   Past Medical History:  Diagnosis Date   GERD (gastroesophageal reflux disease)    Patient Active Problem List   Diagnosis Date Noted   Sprain of medial collateral ligament of right knee 08/17/2021   Past Surgical History:  Procedure Laterality Date   APPENDECTOMY     OB History   No obstetric history on file.    Home Medications    Prior to Admission medications   Medication Sig Start Date End Date Taking? Authorizing Provider  cetirizine (ZYRTEC) 10 MG tablet Take 10 mg by mouth daily.    [provider]  hydrOXYzine (ATARAX) 10 MG tablet Take 10 mg by mouth daily. 08/19/22   [provider]  lidocaine (XYLOCAINE) 2 % solution Use as directed 15 mLs in the mouth or throat every 3 (three) hours as needed for mouth pain. 12/21/22   Rising, Lurena Joiner, PA-C  methocarbamol (ROBAXIN) 500 MG tablet Take 1 tablet  (500 mg total) by mouth 3 (three) times daily as needed. 03/30/23   Trevor Iha, FNP  montelukast (SINGULAIR) 10 MG tablet Take 10 mg at bedtime by mouth.    [provider]  predniSONE (STERAPRED UNI-PAK 21 TAB) 10 MG (21) TBPK tablet Take by mouth daily. Take 6 tabs by mouth daily  for 2 days, then 5 tabs for 2 days, then 4 tabs for 2 days, then 3 tabs for 2 days, 2 tabs for 2 days, then 1 tab by mouth daily for 2 days 03/30/23   Trevor Iha, FNP  Albuterol Sulfate (PROAIR RESPICLICK) 108 (90 BASE) MCG/ACT AEPB Inhale 2 puffs into the lungs every 4 (four) hours as needed. 12/06/14 10/23/15  Sherren Mocha, MD    Family History Family History  Problem Relation Age of Onset   Hypertension Mother    Hypertension Father    Social History Social History   Tobacco Use   Smoking status: Never   Smokeless tobacco: Never  Vaping Use   Vaping status: Never Used  Substance Use Topics   Alcohol use: Yes    Comment: socially   Drug use: No   Allergies   Fluoxetine  Review of Systems Review of Systems Pertinent findings revealed after performing a 14 point review of systems has been noted in the history of present illness.  Physical Exam Vital Signs BP (!) 145/93 (BP Location: Right Arm)   Pulse 87   Temp  98.3 F (36.8 C) (Oral)   Resp 15   SpO2 97%   No data found.  Physical Exam Vitals and nursing note reviewed.  Constitutional:      General: She is not in acute distress.    Appearance: Normal appearance. She is not ill-appearing.  HENT:     Head: Normocephalic and atraumatic.     Salivary Glands: Right salivary gland is not diffusely enlarged or tender. Left salivary gland is not diffusely enlarged or tender.     Right Ear: Tympanic membrane, ear canal and external ear normal. No drainage. No middle ear effusion. There is no impacted cerumen. Tympanic membrane is not erythematous or bulging.     Left Ear: Tympanic membrane, ear canal and external ear normal. No  drainage.  No middle ear effusion. There is no impacted cerumen. Tympanic membrane is not erythematous or bulging.     Nose: Congestion and rhinorrhea present. No nasal deformity, septal deviation or mucosal edema. Rhinorrhea is clear.     Right Turbinates: Not enlarged, swollen or pale.     Left Turbinates: Not enlarged, swollen or pale.     Right Sinus: No maxillary sinus tenderness or frontal sinus tenderness.     Left Sinus: No maxillary sinus tenderness or frontal sinus tenderness.     Mouth/Throat:     Lips: Pink. No lesions.     Mouth: Mucous membranes are moist. No oral lesions.     Pharynx: Oropharynx is clear. Uvula midline. No posterior oropharyngeal erythema or uvula swelling.     Tonsils: No tonsillar exudate. 0 on the right. 0 on the left.  Eyes:     General: Lids are normal.        Right eye: No discharge.        Left eye: No discharge.     Extraocular Movements: Extraocular movements intact.     Conjunctiva/sclera: Conjunctivae normal.     Right eye: Right conjunctiva is not injected.     Left eye: Left conjunctiva is not injected.  Neck:     Trachea: Trachea and phonation normal.  Cardiovascular:     Rate and Rhythm: Normal rate and regular rhythm.     Pulses: Normal pulses.     Heart sounds: Normal heart sounds. No murmur heard.    No friction rub. No gallop.  Pulmonary:     Effort: Pulmonary effort is normal. No tachypnea, bradypnea, accessory muscle usage, prolonged expiration or respiratory distress.     Breath sounds: No stridor, decreased air movement or transmitted upper airway sounds. Examination of the right-lower field reveals rhonchi. Examination of the left-lower field reveals rhonchi. Rhonchi present. No decreased breath sounds, wheezing or rales.  Chest:     Chest wall: No tenderness.  Musculoskeletal:        General: Normal range of motion.     Cervical back: Normal range of motion and neck supple. Normal range of motion.  Lymphadenopathy:      Cervical: No cervical adenopathy.  Skin:    General: Skin is warm and dry.     Findings: No erythema or rash.  Neurological:     General: No focal deficit present.     Mental Status: She is alert and oriented to person, place, and time.  Psychiatric:        Mood and Affect: Mood normal.        Behavior: Behavior normal.     Visual Acuity Right Eye Distance:   Left Eye Distance:  Bilateral Distance:    Right Eye Near:   Left Eye Near:    Bilateral Near:     UC Couse / Diagnostics / Procedures:     Radiology No results found.  Procedures Procedures (including critical care time) EKG  Pending results:  Labs Reviewed - No data to display  Medications Ordered in UC: Medications - No data to display  UC Diagnoses / Final Clinical Impressions(s)   I have reviewed the triage vital signs and the nursing notes.  Pertinent labs & imaging results that were available during my care of the patient were reviewed by me and considered in my medical decision making (see chart for details).    Final diagnoses:  Influenza-like illness  Exposure to influenza   Patient advised that because she was exposed to influenza we will start her on Tamiflu now for empiric treatment of presumed influenza infection.  Patient further advised that we had a limited supply of rapid influenza test here at this location and because she does not have a fever at this time, we will not be testing her for influenza today.  Patient provided with supportive medications including Atrovent nasal spray to dry up nasal secretions, albuterol inhaler for nighttime wheezing, guaifenesin to help promote expectoration and Promethazine DM to calm nighttime cough.  Patient was previously taking Zyrtec, have renewed this is a will also assist with reducing respiratory inflammation.  Conservative care recommended.  Return precautions advised.  Please see discharge instructions below for details of plan of care as provided to  patient. ED Prescriptions     Medication Sig Dispense Auth. Provider   cetirizine (ZYRTEC ALLERGY) 10 MG tablet Take 1 tablet (10 mg total) by mouth at bedtime. 90 tablet Theadora Rama Scales, PA-C   ipratropium (ATROVENT) 0.06 % nasal spray Place 2 sprays into both nostrils 3 (three) times daily. As needed for nasal congestion, runny nose 15 mL Theadora Rama Scales, PA-C   oseltamivir (TAMIFLU) 75 MG capsule Take 1 capsule (75 mg total) by mouth every 12 (twelve) hours for 5 days. 10 capsule Theadora Rama Scales, PA-C   guaifenesin (HUMIBID E) 400 MG TABS tablet Take 1 tablet 3 times daily as needed for chest congestion and cough 30 tablet Theadora Rama Scales, PA-C   promethazine-dextromethorphan (PROMETHAZINE-DM) 6.25-15 MG/5ML syrup Take 5 mLs by mouth at bedtime as needed for cough. 60 mL Theadora Rama Scales, PA-C   albuterol (VENTOLIN HFA) 108 (90 Base) MCG/ACT inhaler Inhale 2 puffs into the lungs every 6 (six) hours as needed for wheezing or shortness of breath (Cough). 18 g Theadora Rama Scales, PA-C      PDMP not reviewed this encounter.  Pending results:  Labs Reviewed - No data to display    Discharge Instructions      I apologize, again, that we do not have an adequate supply of influenza test here at this location.  Urgent care facilities have been hit hard with flu testing disease and and supplies have run out very quickly.  Because you been exposed to influenza, both through your work and family, I recommend that you begin Tamiflu empirically for presumed influenza.  Tamiflu stops the influenza virus from continuing to reproduce itself in your body thereby shutting down its ability to make you any sicker than you already are.  Ultimately, you feel better sooner.  Please read below to learn more about the other medications, dosages and frequencies that I recommend to help alleviate your symptoms and to get you feeling  better soon:   Zyrtec (cetirizine): This  is an excellent second-generation antihistamine that helps to reduce respiratory inflammatory response to environmental allergens.  In some patients, this medication can cause daytime sleepiness so I recommend that you take 1 tablet daily at bedtime.     Atrovent (ipratropium): This is an excellent nasal decongestant spray that does not cause rebound congestion.  Please instill 2 sprays into each nare with each use, you may use this up to 3 times daily.  Once you find that you are forgetting to use the spray more often that you remember to use it, you will know that you no longer need it.   ProAir, Ventolin, Proventil (albuterol): This inhaled medication contains a short acting beta agonist bronchodilator.  This medication relaxes the smooth muscle of the airway in the lungs.  When these muscles are tight, breathing becomes more constricted.  The result of relaxation of the smooth muscle is increased air movement and improved work of breathing.  This is a short acting medication that can be used every 4-6 hours as needed for increased work of breathing, shortness of breath, wheezing and excessive coughing.  It comes in the form of a handheld inhaler or nebulizer solution.  I recommended that for the next 3 to 4 days, this medication is used 4 times daily on a scheduled basis then decrease to twice daily and as needed until symptoms have completely resolved which I anticipate will be several weeks.     Robitussin, Mucinex (guaifenesin): This is a daytime expectorant.  This single symptom reliever helps break up chest congestion and loosen up thick nasal drainage making phlegm and drainage easier to cough up and to blow out from your nose.  I recommend taking 400 mg in either liquid or tablet form three times daily as needed.  I do not recommend the 12-hour extended relief version or doses higher than 400 mg per each dose as these often make some patients feel jittery or jumpy and can interfere with sleep.  I  also do not recommend that you purchase guaifenesin with the ingredient " DM" which is dextromethorphan, a cough suppressant which I only recommend taking at bedtime.  Guaifenesin 400 mg is a safe dose for people who are being treated for high blood pressure.     Promethazine DM: Promethazine is both a nasal decongestant that dries up mucous membranes and an antinausea medication.  Promethazine often makes most patients feel fairly sleepy.  "DM" is dextromethorphan, a single symptom reliever which is a cough suppressant found in many over-the-counter cough medications and combination cold preparations.  Please take 5 mL before bedtime to minimize your cough which will help you sleep better.  I have sent a prescription for this medication to your pharmacy because it cannot be purchased over-the-counter.   If symptoms have not meaningfully improved in the next 5 to 7 days, please return for repeat evaluation or follow-up with your regular provider.  If symptoms have worsened in the next 3 to 5 days, please go to the emergency room for further evaluation.    Thank you for visiting urgent care today.  I sincerely hope that your grandmother has a speedy recovery.  Please know that we appreciate the opportunity to participate in your care.       Disposition Upon Discharge:  Condition: stable for discharge home  Patient presented with an acute illness with associated systemic symptoms and significant discomfort requiring urgent management. In my opinion, this is a  condition that a prudent lay person (someone who possesses an average knowledge of health and medicine) may potentially expect to result in complications if not addressed urgently such as respiratory distress, impairment of bodily function or dysfunction of bodily organs.   Routine symptom specific, illness specific and/or disease specific instructions were discussed with the patient and/or caregiver at length.   As such, the patient has been  evaluated and assessed, work-up was performed and treatment was provided in alignment with urgent care protocols and evidence based medicine.  Patient/parent/caregiver has been advised that the patient may require follow up for further testing and treatment if the symptoms continue in spite of treatment, as clinically indicated and appropriate.  Patient/parent/caregiver has been advised to return to the [facility] or PCP if no better; to PCP or the Emergency Department if new signs and symptoms develop, or if the current signs or symptoms continue to change or worsen for further workup, evaluation and treatment as clinically indicated and appropriate  The patient will follow up with their current PCP if and as advised. If the patient does not currently have a PCP we will assist them in obtaining one.   The patient may need specialty follow up if the symptoms continue, in spite of conservative treatment and management, for further workup, evaluation, consultation and treatment as clinically indicated and appropriate.  Patient/parent/caregiver verbalized understanding and agreement of plan as discussed.  All questions were addressed during visit.  Please see discharge instructions below for further details of plan.  This office note has been dictated using Teaching laboratory technician.  Unfortunately, this method of dictation can sometimes lead to typographical or grammatical errors.  I apologize for your inconvenience in advance if this occurs.  Please do not hesitate to reach out to me if clarification is needed.      Theadora Rama [place], [place] 11/18/23 [phone]

## 2023-11-18 NOTE — ED Triage Notes (Signed)
Patient presents to UC for cough, wheezing x 2 days. Treating symptoms with zinc, cold/flu, and OTC cough med.

## 2024-07-16 ENCOUNTER — Ambulatory Visit (INDEPENDENT_AMBULATORY_CARE_PROVIDER_SITE_OTHER)

## 2024-07-16 ENCOUNTER — Ambulatory Visit: Admission: EM | Admit: 2024-07-16 | Discharge: 2024-07-16 | Disposition: A | Discharge status: Home / Self Care

## 2024-07-16 DIAGNOSIS — M25552 Pain in left hip: Secondary | ICD-10-CM | POA: Diagnosis not present

## 2024-07-16 DIAGNOSIS — M5432 Sciatica, left side: Secondary | ICD-10-CM | POA: Diagnosis not present

## 2024-07-16 MED ORDER — CYCLOBENZAPRINE HCL 10 MG PO TABS: 5.0000 mg | ORAL_TABLET | Freq: Every evening | ORAL | 0 refills | Status: AC

## 2024-07-16 MED ORDER — PREDNISONE 20 MG PO TABS: 40.0000 mg | ORAL_TABLET | Freq: Every day | ORAL | 0 refills | Status: AC

## 2024-07-16 MED ORDER — METHYLPREDNISOLONE SODIUM SUCC 125 MG IJ SOLR
60.0000 mg | Freq: Once | INTRAMUSCULAR | Status: AC
  Administered 2024-07-16: 60 mg via INTRAMUSCULAR

## 2024-07-16 NOTE — Discharge Instructions (Addendum)
 The xray of your hip is normal.   The steroid injection should continue to kick in for several hours Starting tomorrow morning, take the prednisone  (oral steroid) Please do not use any ibuprofen , Advil , Aleve, naproxen, etc while taking prednisone .  You can try the flexeril at bedtime. This is a muscle relaxer.   Please follow up with  our primary care provider if symptoms are persisting.

## 2024-07-16 NOTE — ED Triage Notes (Signed)
 Pt c/o LT hip pain x 2 days. Pain radiates down LT leg and calf. Massage, heat, lidocaine  patches, tylenol /ibuprofen  prn.

## 2024-07-16 NOTE — ED Provider Notes (Signed)
 [facility]    CSN: [id number] Arrival date & time: 07/16/24  0800      History   Chief Complaint Chief Complaint  Patient presents with   Hip Pain    LT    HPI [facility] is a 43 y.o. female.  2 day history of left hip/ low back pain that radiates down the left leg. Rating pain 7/10 currently. Sore to touch and certain movements.  No weakness, paresthesias Denies bladder/bowel dysfunction  No known injury, trauma, or fall Has tried heat, lidocaine  patch, tylenol  and ibuprofen   History of this several years ago, also left sided   Past Medical History:  Diagnosis Date   GERD (gastroesophageal reflux disease)     Patient Active Problem List   Diagnosis Date Noted   Sprain of medial collateral ligament of right knee 08/17/2021    Past Surgical History:  Procedure Laterality Date   APPENDECTOMY      OB History   No obstetric history on file.      Home Medications    Prior to Admission medications   Medication Sig Start Date End Date Taking? Authorizing Provider  cyclobenzaprine (FLEXERIL) 10 MG tablet Take 0.5 tablets (5 mg total) by mouth at bedtime. 07/16/24  Yes Ralene Gasparyan, Asberry, PA-C  predniSONE  (DELTASONE ) 20 MG tablet Take 2 tablets (40 mg total) by mouth daily with breakfast for 5 days. 07/17/24 07/22/24 Yes Teale Goodgame, Asberry, PA-C  semaglutide-weight management (WEGOVY) 1.7 MG/0.75ML SOAJ SQ injection Inject 1.7 mg into the skin.   Yes [provider]  albuterol  (VENTOLIN  HFA) 108 (90 Base) MCG/ACT inhaler Inhale 2 puffs into the lungs every 6 (six) hours as needed for wheezing or shortness of breath (Cough). 11/18/23   Joesph Shaver Scales, PA-C  cetirizine  (ZYRTEC  ALLERGY) 10 MG tablet Take 1 tablet (10 mg total) by mouth at bedtime. 11/18/23 05/16/24  Joesph Shaver Scales, PA-C  hydrOXYzine (ATARAX) 10 MG tablet Take 10 mg by mouth daily. 08/19/22   [provider]  ipratropium (ATROVENT ) 0.06 % nasal spray Place 2  sprays into both nostrils 3 (three) times daily. As needed for nasal congestion, runny nose 11/18/23   Joesph Shaver Scales, PA-C  lidocaine  (XYLOCAINE ) 2 % solution Use as directed 15 mLs in the mouth or throat every 3 (three) hours as needed for mouth pain. 12/21/22   Hiliary Osorto, PA-C  montelukast (SINGULAIR) 10 MG tablet Take 10 mg at bedtime by mouth.    [provider]    Family History Family History  Problem Relation Age of Onset   Hypertension Mother    Hypertension Father     Social History Social History   Tobacco Use   Smoking status: Never   Smokeless tobacco: Never  Vaping Use   Vaping status: Never Used  Substance Use Topics   Alcohol use: Yes    Comment: socially   Drug use: No     Allergies   Fluoxetine   Review of Systems Review of Systems  As per HPI  Physical Exam Triage Vital Signs ED Triage Vitals  Encounter Vitals Group     BP      Girls Systolic BP Percentile      Girls Diastolic BP Percentile      Boys Systolic BP Percentile      Boys Diastolic BP Percentile      Pulse      Resp      Temp      Temp src  SpO2      Weight      Height      Head Circumference      Peak Flow      Pain Score      Pain Loc      Pain Education      Exclude from Growth Chart    No data found.  Updated Vital Signs BP (!) 147/88 (BP Location: Right Arm)   Pulse 100   Temp 98.5 F (36.9 C) (Oral)   Resp 17   LMP  (LMP Unknown)   SpO2 98%    Physical Exam Vitals and nursing note reviewed.  Constitutional:      General: She is not in acute distress.    Appearance: She is not ill-appearing.     Comments: Patient standing for exam due to pain   HENT:     Mouth/Throat:     Mouth: Mucous membranes are moist.     Pharynx: Oropharynx is clear.  Eyes:     Conjunctiva/sclera: Conjunctivae normal.     Pupils: Pupils are equal, round, and reactive to light.  Cardiovascular:     Rate and Rhythm: Normal rate and regular rhythm.      Pulses: Normal pulses.     Heart sounds: Normal heart sounds.  Pulmonary:     Effort: Pulmonary effort is normal.     Breath sounds: Normal breath sounds.  Musculoskeletal:     Cervical back: Normal range of motion. No rigidity, tenderness or bony tenderness.     Thoracic back: No bony tenderness.     Lumbar back: No swelling, deformity or bony tenderness. Normal range of motion. Positive left straight leg raise test.  Skin:    General: Skin is warm and dry.  Neurological:     General: No focal deficit present.     Mental Status: She is alert and oriented to person, place, and time.     Cranial Nerves: Cranial nerves 2-12 are intact. No cranial nerve deficit.     Sensory: Sensation is intact.     Motor: Motor function is intact. No weakness.     Coordination: Coordination is intact.     Gait: Gait is intact.     Deep Tendon Reflexes: Reflexes are normal and symmetric.     Comments: Strength 5/5. Sensation intact throughout     UC Treatments / Results  Labs (all labs ordered are listed, but only abnormal results are displayed) Labs Reviewed - No data to display  EKG   Radiology DG Hip Unilat With Pelvis 2-3 Views Left Result Date: 07/16/2024 CLINICAL DATA:  373049 Hip pain, acute [id number] EXAM: DG HIP (WITH OR WITHOUT PELVIS) 2-3V LEFT COMPARISON:  None Available. FINDINGS: No evidence of pelvic fracture or diastasis.No acute hip fracture or dislocation.There is no evidence of arthropathy or other focal bone abnormality. Soft tissues are unremarkable. IMPRESSION: No acute fracture, pelvic bone diastasis, or dislocation. Electronically Signed   By: Rogelia Myers M.D.   On: 07/16/2024 09:25    Procedures Procedures (including critical care time)  Medications Ordered in UC Medications  methylPREDNISolone  sodium succinate (SOLU-MEDROL ) 125 mg/2 mL injection 60 mg (60 mg Intramuscular Given 07/16/24 0838)    Initial Impression / Assessment and Plan / UC Course  I have reviewed  the triage vital signs and the nursing notes.  Pertinent labs & imaging results that were available during my care of the patient were reviewed by me and considered in my medical decision making (see  chart for details).  Stable vitals, neurologically intact Left low back pain with left side sciatica No improvement with home NSAIDs  Reviewed labs from most recent visit March 2025  Have offered IM steroid in clinic today, patient agreeable Although I do believe this is nerve/muscle inflammation, patient would like an xray today. Imaging negative. Images independently reviewed by me, agree with radiology interpretation. Prednisone  burst, flexeril with drowsy precautions Return and ED precautions Agrees to plan, all questions answered  Final Clinical Impressions(s) / UC Diagnoses   Final diagnoses:  Left hip pain  Left sided sciatica     Discharge Instructions      The xray of your hip is normal.   The steroid injection should continue to kick in for several hours Starting tomorrow morning, take the prednisone  (oral steroid) Please do not use any ibuprofen , Advil , Aleve, naproxen, etc while taking prednisone .  You can try the flexeril at bedtime. This is a muscle relaxer.   Please follow up with  our primary care provider if symptoms are persisting.      ED Prescriptions     Medication Sig Dispense Auth. Provider   predniSONE  (DELTASONE ) 20 MG tablet Take 2 tablets (40 mg total) by mouth daily with breakfast for 5 days. 10 tablet Dray Dente, PA-C   cyclobenzaprine (FLEXERIL) 10 MG tablet Take 0.5 tablets (5 mg total) by mouth at bedtime. 10 tablet Jsiah Menta, Asberry, PA-C      PDMP not reviewed this encounter.   Jeryl Asberry, PA-C 07/16/24 0931
